# Patient Record
Sex: Female | Born: 1965 | Race: Black or African American | Hispanic: No | Marital: Single | Smoking: Never smoker
Health system: Southern US, Community
[De-identification: ages and names within clinical notes are randomized; demographics above are authoritative.]

## PROBLEM LIST (undated history)

## (undated) DIAGNOSIS — R51 Headache: Secondary | ICD-10-CM

## (undated) DIAGNOSIS — R519 Headache, unspecified: Secondary | ICD-10-CM

## (undated) DIAGNOSIS — R030 Elevated blood-pressure reading, without diagnosis of hypertension: Secondary | ICD-10-CM

## (undated) DIAGNOSIS — R87629 Unspecified abnormal cytological findings in specimens from vagina: Secondary | ICD-10-CM

## (undated) DIAGNOSIS — A549 Gonococcal infection, unspecified: Secondary | ICD-10-CM

## (undated) DIAGNOSIS — D573 Sickle-cell trait: Secondary | ICD-10-CM

## (undated) DIAGNOSIS — F419 Anxiety disorder, unspecified: Secondary | ICD-10-CM

## (undated) HISTORY — DX: Gonococcal infection, unspecified: A54.9

## (undated) HISTORY — DX: Headache: R51

## (undated) HISTORY — DX: Sickle-cell trait: D57.3

## (undated) HISTORY — DX: Elevated blood-pressure reading, without diagnosis of hypertension: R03.0

## (undated) HISTORY — PX: ABLATION SAPHENOUS VEIN W/ RFA: SUR11

## (undated) HISTORY — DX: Unspecified abnormal cytological findings in specimens from vagina: R87.629

## (undated) HISTORY — DX: Headache, unspecified: R51.9

## (undated) HISTORY — PX: WISDOM TOOTH EXTRACTION: SHX21

---

## 1986-07-07 DIAGNOSIS — A549 Gonococcal infection, unspecified: Secondary | ICD-10-CM

## 1986-07-07 HISTORY — DX: Gonococcal infection, unspecified: A54.9

## 1998-07-06 ENCOUNTER — Emergency Department (HOSPITAL_COMMUNITY): Admission: EM | Admit: 1998-07-06 | Discharge: 1998-07-06 | Payer: Self-pay | Admitting: Emergency Medicine

## 2003-06-02 ENCOUNTER — Encounter: Admission: RE | Admit: 2003-06-02 | Discharge: 2003-06-02 | Payer: Self-pay | Admitting: Unknown Physician Specialty

## 2007-04-22 ENCOUNTER — Encounter: Admission: RE | Admit: 2007-04-22 | Discharge: 2007-04-22 | Payer: Self-pay | Admitting: Family Medicine

## 2007-04-29 ENCOUNTER — Encounter: Admission: RE | Admit: 2007-04-29 | Discharge: 2007-04-29 | Payer: Self-pay | Admitting: Family Medicine

## 2008-01-03 ENCOUNTER — Encounter: Admission: RE | Admit: 2008-01-03 | Discharge: 2008-01-03 | Payer: Self-pay | Admitting: Family Medicine

## 2008-01-05 HISTORY — PX: TOE SURGERY: SHX1073

## 2008-01-13 ENCOUNTER — Ambulatory Visit (HOSPITAL_BASED_OUTPATIENT_CLINIC_OR_DEPARTMENT_OTHER): Admission: RE | Admit: 2008-01-13 | Discharge: 2008-01-13 | Payer: Self-pay | Admitting: Orthopedic Surgery

## 2008-04-24 ENCOUNTER — Encounter: Admission: RE | Admit: 2008-04-24 | Discharge: 2008-04-24 | Payer: Self-pay | Admitting: Family Medicine

## 2008-12-08 ENCOUNTER — Ambulatory Visit: Payer: Self-pay | Admitting: Family Medicine

## 2008-12-08 DIAGNOSIS — D179 Benign lipomatous neoplasm, unspecified: Secondary | ICD-10-CM | POA: Insufficient documentation

## 2008-12-08 DIAGNOSIS — R030 Elevated blood-pressure reading, without diagnosis of hypertension: Secondary | ICD-10-CM

## 2009-04-16 ENCOUNTER — Ambulatory Visit: Payer: Self-pay | Admitting: Family Medicine

## 2009-04-16 LAB — CONVERTED CEMR LAB
ALT: 16 units/L (ref 0–35)
AST: 21 units/L (ref 0–37)
Albumin: 3.9 g/dL (ref 3.5–5.2)
Alkaline Phosphatase: 45 units/L (ref 39–117)
BUN: 9 mg/dL (ref 6–23)
Basophils Absolute: 0 10*3/uL (ref 0.0–0.1)
Basophils Relative: 0.4 % (ref 0.0–3.0)
Bilirubin Urine: NEGATIVE
Bilirubin, Direct: 0.1 mg/dL (ref 0.0–0.3)
CO2: 27 meq/L (ref 19–32)
Calcium: 8.7 mg/dL (ref 8.4–10.5)
Chloride: 105 meq/L (ref 96–112)
Cholesterol: 147 mg/dL (ref 0–200)
Creatinine, Ser: 0.8 mg/dL (ref 0.4–1.2)
Eosinophils Absolute: 0 10*3/uL (ref 0.0–0.7)
Eosinophils Relative: 0.2 % (ref 0.0–5.0)
GFR calc non Af Amer: 101.09 mL/min (ref >60)
Glucose, Bld: 76 mg/dL (ref 70–99)
HCT: 43.2 % (ref 36.0–46.0)
HDL: 53.8 mg/dL (ref >39.00)
Hemoglobin, Urine: NEGATIVE
Hemoglobin: 15 g/dL (ref 12.0–15.0)
Ketones, ur: NEGATIVE mg/dL
LDL Cholesterol: 85 mg/dL (ref 0–99)
Leukocytes, UA: NEGATIVE
Lymphocytes Relative: 34.3 % (ref 12.0–46.0)
Lymphs Abs: 3.6 10*3/uL (ref 0.7–4.0)
MCHC: 34.8 g/dL (ref 30.0–36.0)
MCV: 90.5 fL (ref 78.0–100.0)
Monocytes Absolute: 0.4 10*3/uL (ref 0.1–1.0)
Monocytes Relative: 4.1 % (ref 3.0–12.0)
Neutro Abs: 6.5 10*3/uL (ref 1.4–7.7)
Neutrophils Relative %: 61 % (ref 43.0–77.0)
Nitrite: NEGATIVE
Platelets: 267 10*3/uL (ref 150.0–400.0)
Potassium: 4 meq/L (ref 3.5–5.1)
RBC: 4.77 M/uL (ref 3.87–5.11)
RDW: 11.9 % (ref 11.5–14.6)
Sodium: 138 meq/L (ref 135–145)
Specific Gravity, Urine: 1.02 (ref 1.000–1.030)
TSH: 1.91 microintl units/mL (ref 0.35–5.50)
Total Bilirubin: 0.6 mg/dL (ref 0.3–1.2)
Total CHOL/HDL Ratio: 3
Total Protein, Urine: NEGATIVE mg/dL
Total Protein: 7.3 g/dL (ref 6.0–8.3)
Triglycerides: 41 mg/dL (ref 0.0–149.0)
Urine Glucose: NEGATIVE mg/dL
Urobilinogen, UA: 0.2 (ref 0.0–1.0)
VLDL: 8.2 mg/dL (ref 0.0–40.0)
WBC: 10.5 10*3/uL (ref 4.5–10.5)
pH: 5.5 (ref 5.0–8.0)

## 2009-04-24 ENCOUNTER — Encounter: Payer: Self-pay | Admitting: Family Medicine

## 2009-04-24 ENCOUNTER — Ambulatory Visit: Payer: Self-pay | Admitting: Family Medicine

## 2009-04-24 ENCOUNTER — Other Ambulatory Visit: Admission: RE | Admit: 2009-04-24 | Discharge: 2009-04-24 | Payer: Self-pay | Admitting: Family Medicine

## 2009-04-24 DIAGNOSIS — F4323 Adjustment disorder with mixed anxiety and depressed mood: Secondary | ICD-10-CM

## 2009-04-24 DIAGNOSIS — N6019 Diffuse cystic mastopathy of unspecified breast: Secondary | ICD-10-CM

## 2009-04-25 ENCOUNTER — Encounter: Admission: RE | Admit: 2009-04-25 | Discharge: 2009-04-25 | Payer: Self-pay | Admitting: Family Medicine

## 2009-05-16 ENCOUNTER — Ambulatory Visit: Payer: Self-pay | Admitting: Family Medicine

## 2009-06-22 ENCOUNTER — Telehealth: Payer: Self-pay | Admitting: Family Medicine

## 2009-08-23 ENCOUNTER — Ambulatory Visit: Payer: Self-pay | Admitting: Family Medicine

## 2009-09-07 ENCOUNTER — Telehealth: Payer: Self-pay | Admitting: Family Medicine

## 2010-01-08 ENCOUNTER — Ambulatory Visit: Payer: Self-pay | Admitting: Family Medicine

## 2010-01-08 DIAGNOSIS — N3000 Acute cystitis without hematuria: Secondary | ICD-10-CM | POA: Insufficient documentation

## 2010-01-08 DIAGNOSIS — N390 Urinary tract infection, site not specified: Secondary | ICD-10-CM

## 2010-01-08 LAB — CONVERTED CEMR LAB
Bilirubin Urine: NEGATIVE
Glucose, Urine, Semiquant: NEGATIVE
Ketones, urine, test strip: NEGATIVE
Nitrite: NEGATIVE
Protein, U semiquant: NEGATIVE
Specific Gravity, Urine: <1.005
Urobilinogen, UA: 0.2
pH: 7.5

## 2010-01-23 ENCOUNTER — Telehealth: Payer: Self-pay | Admitting: Family Medicine

## 2010-02-04 ENCOUNTER — Telehealth: Payer: Self-pay | Admitting: Family Medicine

## 2010-04-18 ENCOUNTER — Ambulatory Visit: Payer: Self-pay | Admitting: Family Medicine

## 2010-04-18 LAB — CONVERTED CEMR LAB
ALT: 14 units/L (ref 0–35)
AST: 18 units/L (ref 0–37)
Albumin: 3.7 g/dL (ref 3.5–5.2)
Alkaline Phosphatase: 51 units/L (ref 39–117)
BUN: 11 mg/dL (ref 6–23)
Basophils Absolute: 0 10*3/uL (ref 0.0–0.1)
Basophils Relative: 0.5 % (ref 0.0–3.0)
Bilirubin Urine: NEGATIVE
Bilirubin, Direct: 0.1 mg/dL (ref 0.0–0.3)
CO2: 28 meq/L (ref 19–32)
Calcium: 9 mg/dL (ref 8.4–10.5)
Chloride: 103 meq/L (ref 96–112)
Cholesterol: 153 mg/dL (ref 0–200)
Creatinine, Ser: 0.8 mg/dL (ref 0.4–1.2)
Eosinophils Absolute: 0 10*3/uL (ref 0.0–0.7)
Eosinophils Relative: 0.5 % (ref 0.0–5.0)
GFR calc non Af Amer: 103.59 mL/min (ref >60)
Glucose, Bld: 82 mg/dL (ref 70–99)
HCT: 41.7 % (ref 36.0–46.0)
HDL: 55.1 mg/dL (ref >39.00)
Hemoglobin, Urine: NEGATIVE
Hemoglobin: 14.7 g/dL (ref 12.0–15.0)
Ketones, ur: NEGATIVE mg/dL
LDL Cholesterol: 87 mg/dL (ref 0–99)
Leukocytes, UA: NEGATIVE
Lymphocytes Relative: 37.9 % (ref 12.0–46.0)
Lymphs Abs: 3.2 10*3/uL (ref 0.7–4.0)
MCHC: 35.3 g/dL (ref 30.0–36.0)
MCV: 92.6 fL (ref 78.0–100.0)
Monocytes Absolute: 0.4 10*3/uL (ref 0.1–1.0)
Monocytes Relative: 5 % (ref 3.0–12.0)
Neutro Abs: 4.7 10*3/uL (ref 1.4–7.7)
Neutrophils Relative %: 56.1 % (ref 43.0–77.0)
Nitrite: NEGATIVE
Platelets: 283 10*3/uL (ref 150.0–400.0)
Potassium: 4.5 meq/L (ref 3.5–5.1)
RBC: 4.5 M/uL (ref 3.87–5.11)
RDW: 12.9 % (ref 11.5–14.6)
Sodium: 137 meq/L (ref 135–145)
Specific Gravity, Urine: 1.015 (ref 1.000–1.030)
TSH: 2.51 microintl units/mL (ref 0.35–5.50)
Total Bilirubin: 0.4 mg/dL (ref 0.3–1.2)
Total CHOL/HDL Ratio: 3
Total Protein, Urine: NEGATIVE mg/dL
Total Protein: 7.2 g/dL (ref 6.0–8.3)
Triglycerides: 57 mg/dL (ref 0.0–149.0)
Urine Glucose: NEGATIVE mg/dL
Urobilinogen, UA: 0.2 (ref 0.0–1.0)
VLDL: 11.4 mg/dL (ref 0.0–40.0)
WBC: 8.4 10*3/uL (ref 4.5–10.5)
pH: 7.5 (ref 5.0–8.0)

## 2010-04-25 ENCOUNTER — Telehealth: Payer: Self-pay | Admitting: Family Medicine

## 2010-04-25 ENCOUNTER — Ambulatory Visit: Payer: Self-pay | Admitting: Family Medicine

## 2010-04-25 ENCOUNTER — Other Ambulatory Visit: Admission: RE | Admit: 2010-04-25 | Discharge: 2010-04-25 | Payer: Self-pay | Admitting: Family Medicine

## 2010-04-25 LAB — CONVERTED CEMR LAB: Pap Smear: NEGATIVE

## 2010-04-26 ENCOUNTER — Encounter: Admission: RE | Admit: 2010-04-26 | Discharge: 2010-04-26 | Payer: Self-pay | Admitting: Family Medicine

## 2010-08-08 NOTE — Assessment & Plan Note (Signed)
Summary: CPX/PAP/CJR   Vital Signs:  Patient profile:   45 year old female Menstrual status:  regular LMP:     04/13/2010 Height:      64.25 inches Weight:      157 pounds Temp:     98.2 degrees F oral BP sitting:   118 / 80  (left arm)  Vitals Entered By: Kern Reap CMA Duncan Dull) (April 25, 2010 9:44 AM) CC: cpx LMP (date): 04/13/2010     Enter LMP: 04/13/2010 Last PAP Result NEGATIVE FOR INTRAEPITHELIAL LESIONS OR MALIGNANCY.   CC:  cpx.  History of Present Illness: Erin [name] is a 45 year old single female, nonsmoker, who comes in today for general physical examination,  She's always been in excellent health.  She had no chronic health problems.  She does take her BCPs.  LMP 10 8, normal.  She does BSE monthly and gets annual mammography.  Negative family history of breast cancer.  However, she does have fibrocystic changes throughout both breasts.  She takes Celexa 40 mg nightly for mild depression and would like to try to taper off her Celexa.  She gets routine eye care, dental care, BSE monthly, annual mammography, tetanus, 2010, seasonal flu 2011  Allergies: 1)  ! Sulfamethoxazole (Sulfamethoxazole)  Past History:  Past medical, surgical, family and social histories (including risk factors) reviewed, and no changes noted (except as noted below).  Past Medical History: Reviewed history from 12/08/2008 and no changes required. broken toe vein ablation sickle cell trait high blood pressure - hx  Past Surgical History: Reviewed history from 12/08/2008 and no changes required. broken toe vein ablation  Family History: Reviewed history from 12/08/2008 and no changes required. Father: deceased - TB? Mother: HTN Siblings: 3 sisters               1- HTN               1- Gall bladder removed               1 - joint issues  Social History: Reviewed history from 12/08/2008 and no changes required. 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 No apparent alopecia or balding. Eyes:  No corneal or conjunctival inflammation noted. EOMI. Perrla. Funduscopic exam benign, without hemorrhages, exudates or papilledema. Vision grossly normal. Ears:  External ear exam shows no significant lesions or deformities.  Otoscopic examination reveals clear canals, tympanic membranes are intact bilaterally without bulging, retraction, inflammation or discharge. Hearing is grossly normal bilaterally. Nose:  External nasal examination shows no deformity or inflammation. Nasal mucosa are pink and moist without lesions or exudates. Mouth:  Oral mucosa and oropharynx without lesions or exudates.  Teeth in good repair. Neck:  No deformities, masses, or tenderness noted. Chest Wall:  No deformities, masses, or tenderness noted. Breasts:  she has fibrocystic changes throughout both breasts at the 12 o'clock position. Lungs:  Normal respiratory effort, chest expands symmetrically. Lungs are clear to auscultation, no crackles or wheezes. Heart:  Normal rate and regular rhythm. S1 and S2 normal without gallop, murmur, click, rub or other extra sounds. Abdomen:  Bowel sounds positive,abdomen soft and non-tender without masses, organomegaly or hernias noted. Rectal:  No external abnormalities noted. Normal sphincter tone. No rectal masses or tenderness. Genitalia:  Pelvic  Exam:        External: normal female genitalia without lesions or masses        Vagina: normal without lesions or masses        Cervix: normal without lesions or masses        Adnexa: normal bimanual exam without masses or fullness        Uterus: normal by palpation        Pap smear: performed Msk:  No  deformity or scoliosis noted of thoracic or lumbar spine.   Pulses:  R and L carotid,radial,femoral,dorsalis pedis and posterior tibial pulses are full and equal bilaterally Extremities:  No clubbing, cyanosis, edema, or deformity noted with normal full range of motion of all joints.   Neurologic:  No cranial nerve deficits noted. Station and gait are normal. Plantar reflexes are down-going bilaterally. DTRs are symmetrical throughout. Sensory, motor and coordinative functions appear intact.   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 Take one tab once daily 2)  Glucosamine Complex Tabs (Nutritional supplements) .... Once daily 3)  Daily Multi Tabs (Multiple vitamins-minerals) .... Once daily 4)  Aspir-low 81 Mg Tbec (Aspirin) .... Once daily 5)  Celexa 40 Mg Tabs (Citalopram hydrobromide) .... One nightly  Patient Instructions: 1)  begin to taper the Celexa by taking 20 mg nightly at bedtime for one month then 20 mg Monday, Wednesday, Friday, for another month, then stop if you feel okay.  If not, we start 40 mg in stay on it and we will discuss it next year 2)  Please schedule a follow-up appointment in 1 year. 3)  It is important that you exercise regularly at least 20 minutes 5 times a week. If you develop chest pain, have severe difficulty breathing, or feel very tired , stop exercising immediately and seek medical attention. 4)  Schedule your mammogram. 5)  Take an Aspirin every day. Prescriptions: CELEXA 40 MG TABS (CITALOPRAM HYDROBROMIDE) One nightly   #100 x 3   Entered and Authorized by:   Roderick Pee MD   Signed by:   Roderick Pee MD on 04/25/2010   Method used:   Electronically to        [facility] [phone]* (retail)       [address] [place], [place]  [address]       Ph: [phone]       Fax: [phone]   RxID:   [id number] YAZ 3-0.02 MG TABS (DROSPIRENONE-ETHINYL ESTRADIOL) take one tab once daily  #3 x 3   Entered and Authorized by:   Roderick Pee MD   Signed by:   Roderick Pee MD on 04/25/2010   Method used:   Electronically to        [facility] # [phone]* (retail)       [address] [place], [place]  [address]       Ph: [phone]       Fax: [phone]   RxID:   [id number]    Orders Added: 1)  Prescription Created Electronically [phone]  2)  Est. Patient 40-64 years [[id number]]   Immunization History:  Influenza Immunization History:    Influenza:  historical (04/06/2010)   Immunization History:  Influenza Immunization History:    Influenza:  Historical (04/06/2010)

## 2010-08-08 NOTE — Progress Notes (Signed)
Summary: Pt changing pharmacy to [facility]-Req Dianah Field and Celexa  Phone Note Refill Request Message from:  Patient on January 23, 2010 10:34 AM  Refills Requested: Medication #1:  YAZ 3-0.02 MG TABS take one tab once daily   Dosage confirmed as above?Dosage Confirmed   Brand Name Necessary? Yes   Supply Requested: 3 months  Medication #2:  CELEXA 40 MG TABS One nightly   Dosage confirmed as above?Dosage Confirmed   Brand Name Necessary? No   Supply Requested: 3 months Pt called and said that [facility]. Pls call in to [facility] [phone] or [phone]        Method Requested: Telephone to [facility]  Next Appointment Scheduled: cpx on 04/25/10  Initial call taken by: Lucy Antigua,  January 23, 2010 10:40 AM Caller: Patient    Prescriptions: CELEXA 40 MG TABS (CITALOPRAM HYDROBROMIDE) One nightly  #100 x 3   Entered by:   Kathrynn Speed CMA   Authorized by:   Roderick Pee MD   Signed by:   Kathrynn Speed CMA on 01/23/2010   Method used:   Faxed to ...       [facility] (mail-order)             , [place]         Ph: [phone]       Fax: [phone]   RxID:   [id number] YAZ 3-0.02 MG TABS (DROSPIRENONE-ETHINYL ESTRADIOL) take one tab once daily  #3 x 3   Entered by:   Kathrynn Speed CMA   Authorized by:   Roderick Pee MD   Signed by:   Kathrynn Speed CMA on 01/23/2010   Method used:   Faxed to ...       [facility] (mail-order)             , [place]         Ph: [phone]       Fax: [phone]   RxID:   [id number]

## 2010-08-08 NOTE — Assessment & Plan Note (Signed)
Summary: ? cystitis//ccm   Vital Signs:  Patient profile:   45 year old female Menstrual status:  regular Height:      64.25 inches Weight:      157 pounds BMI:     26.84 Temp:     98.0 degrees F oral BP sitting:   120 / 80  (left arm) Cuff size:   regular  Vitals Entered By: Kern Reap CMA Duncan Dull) (January 08, 2010 12:22 PM) CC: possible uti   CC:  possible uti.  History of Present Illness: Erin [name] is a 45 year old female, who comes in with a 3-day history of frequency and dysuria.  No fever, chills, or back pain.  LMP 3 weeks ago, normal.  On BCPs  Allergies: 1)  ! Sulfamethoxazole (Sulfamethoxazole)  Past History:  Past medical, surgical, family and social histories (including risk factors) reviewed for relevance to current acute and chronic problems.  Past Medical History: Reviewed history from 12/08/2008 and no changes required. broken toe vein ablation sickle cell trait high blood pressure - hx  Past Surgical History: Reviewed history from 12/08/2008 and no changes required. broken toe vein ablation  Family History: Reviewed history from 12/08/2008 and no changes required. Father: deceased - TB? Mother: HTN Siblings: 3 sisters               1- HTN               1- Gall bladder removed               1 - joint issues  Social History: Reviewed history from 12/08/2008 and no changes required. 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   Problems:  Medical Problems Added: 1)  Dx of Uti  (ICD-599.0) 2)  Dx of Acute Cystitis  (ICD-595.0)  Impression & Recommendations:  Problem # 1:  UTI (ICD-599.0) Assessment  New  Her updated medication list for this problem includes:    Ciprofloxacin Hcl 500 Mg Tabs (Ciprofloxacin hcl) .Marland Kitchen... Take 1 tablet by mouth two times a day  Orders: Prescription Created Electronically [phone])  Complete Medication List: 1)  Yaz 3-0.02 Mg Tabs (Drospirenone-ethinyl estradiol) .... Take one tab once daily 2)  Glucosamine Complex Tabs (Nutritional supplements) .... Once daily 3)  Daily Multi Tabs (Multiple vitamins-minerals) .... Once daily 4)  Aspir-low 81 Mg Tbec (Aspirin) .... Once daily 5)  Celexa 40 Mg Tabs (Citalopram hydrobromide) .... One nightly 6)  Ciprofloxacin Hcl 500 Mg Tabs (Ciprofloxacin hcl) .... Take 1 tablet by mouth two times a day  Other Orders: UA Dipstick w/o Micro (manual) ([id number])  Patient Instructions: 1)  30 ounces of water daily, otc Pyridium 3 times a day as needed.  Also, Cipro, one twice daily for one week.  Return p.r.n. Prescriptions: CIPROFLOXACIN HCL 500 MG TABS (CIPROFLOXACIN HCL) Take 1 tablet by mouth two times a day  #14 x 1   Entered and Authorized by:   Roderick Pee MD   Signed by:   Roderick Pee MD on 01/08/2010   Method used:   Electronically to        [facility] # [phone]* (retail)       [address] [place]  [place], [place]  [address]       Ph: [phone]       Fax: [phone]   RxID:   [id number]   Laboratory Results   Urine Tests  Date/Time Received: January 08, 2010   Routine Urinalysis   Color: yellow Appearance: Clear Glucose: negative   (Normal Range: Negative) Bilirubin: negative   (Normal Range: Negative) Ketone: negative   (Normal Range: Negative) Spec. Gravity: <1.005   (Normal Range: 1.003-1.035) Blood: trace-lysed   (Normal Range: Negative) pH: 7.5   (Normal Range: 5.0-8.0) Protein: negative   (Normal Range: Negative) Urobilinogen: 0.2   (Normal Range: 0-1) Nitrite: negative   (Normal Range: Negative) Leukocyte Esterace: moderate   (Normal Range: Negative)    Comments: Kern Reap CMA (AAMA)  January 08, 2010 12:26 PM

## 2010-08-08 NOTE — Progress Notes (Signed)
Summary: refill  Phone Note Refill Request Call back at Work Phone [phone] Message from:  Patient---live call on Brand name only  Refills Requested: Medication #1:  YAZ 3-0.02 MG TABS take one tab once daily send to [facility] name only  Initial call taken by: Warnell Forester,  February 04, 2010 9:53 AM    Prescriptions: YAZ 3-0.02 MG TABS (DROSPIRENONE-ETHINYL ESTRADIOL) take one tab once daily  #90 days x 3   Entered by:   Kern Reap CMA (AAMA)   Authorized by:   Roderick Pee MD   Signed by:   Kern Reap CMA (AAMA) on 02/04/2010   Method used:   Faxed to ...       [facility] (mail-order)             , [place]         Ph: [phone]       Fax: [phone]   RxID:   [id number]

## 2010-08-08 NOTE — Assessment & Plan Note (Signed)
Summary: chest tightness/njr   Vital Signs:  Patient profile:   45 year old female Menstrual status:  regular Weight:      161 pounds Temp:     98.6 degrees F oral BP sitting:   122 / 88  (left arm) Cuff size:   regular  Vitals Entered By: Kern Reap CMA Duncan Dull) (August 23, 2009 10:11 AM)  Reason for Visit cough and chest congestion  History of Present Illness: Erin [name] is a 45 year old single female, nonsmoker, who comes in today with a 4-day history of head congestion, sore throat, nonproductive cough.  She's had no fever, chills, earache, etc.  Review of systems negative  Allergies: 1)  ! Sulfamethoxazole (Sulfamethoxazole)  Past History:  Past medical, surgical, family and social histories (including risk factors) reviewed for relevance to current acute and chronic problems.  Past Medical History: Reviewed history from 12/08/2008 and no changes required. broken toe vein ablation sickle cell trait high blood pressure - hx  Past Surgical History: Reviewed history from 12/08/2008 and no changes required. broken toe vein ablation  Family History: Reviewed history from 12/08/2008 and no changes required. Father: deceased - TB? Mother: HTN Siblings: 3 sisters               1- HTN               1- Gall bladder removed               1 - joint issues  Social History: Reviewed history from 12/08/2008 and no changes required. 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 No apparent alopecia or balding. Eyes:  No corneal or conjunctival inflammation noted. EOMI. Perrla. Funduscopic exam benign, without hemorrhages, exudates or papilledema. Vision grossly normal. Ears:   External ear exam shows no significant lesions or deformities.  Otoscopic examination reveals clear canals, tympanic membranes are intact bilaterally without bulging, retraction, inflammation or discharge. Hearing is grossly normal bilaterally. Nose:  External nasal examination shows no deformity or inflammation. Nasal mucosa are pink and moist without lesions or exudates. Mouth:  Oral mucosa and oropharynx without lesions or exudates.  Teeth in good repair. Neck:  No deformities, masses, or tenderness noted. Chest Wall:  No deformities, masses, or tenderness noted. Lungs:  Normal respiratory effort, chest expands symmetrically. Lungs are clear to auscultation, no crackles or wheezes.   Problems:  Medical Problems Added: 1)  Dx of Viral Infection-unspec  (ICD-079.99)  Impression & Recommendations:  Problem # 1:  VIRAL INFECTION-UNSPEC (ICD-079.99) Assessment New  Her updated medication list for this problem includes:    Aspir-low 81 Mg Tbec (Aspirin) ..... Once daily    Hydromet 5-1.5 Mg/66ml Syrp (Hydrocodone-homatropine) .Marland Kitchen... 1 or 2 tsps at bedtime as needed  Complete Medication List: 1)  Yaz 3-0.02 Mg Tabs (Drospirenone-ethinyl estradiol) .... Take one tab once daily 2)  Glucosamine Complex Tabs (Nutritional supplements) .... Once daily 3)  Daily Multi Tabs (Multiple vitamins-minerals) .... Once daily 4)  Aspir-low 81 Mg Tbec (Aspirin) .... Once daily 5)  Celexa 40 Mg Tabs (Citalopram hydrobromide) .... One nightly 6)  Hydromet 5-1.5 Mg/56ml Syrp (Hydrocodone-homatropine) .Marland Kitchen.. 1 or 2 tsps at bedtime as needed  Patient Instructions: 1)  drink 30 ounces of water daily, vaporizer in your bedroom at night,......... one  or 2 teaspoons of Hydromet at bedtime as needed for cough and cold.  Return p.r.n. Prescriptions: HYDROMET 5-1.5 MG/5ML SYRP (HYDROCODONE-HOMATROPINE) 1 or 2 tsps at bedtime as needed  #8oz x 0   Entered and Authorized by:   Roderick Pee MD   Signed by:   Roderick Pee MD on 08/23/2009   Method used:   Print then Give to Patient   RxID:   [id number]

## 2010-08-08 NOTE — Progress Notes (Signed)
Summary: hacking cough  Phone Note Call from Patient Call back at [phone]   Summary of Call: Still hacking day & night.  Out of the cough syrup you gave.  Otherwise much better.  [facility]  Allergic to sulfa. Initial call taken by: Rudy Jew, RN,  September 07, 2009 12:11 PM  Follow-up for Phone Call        renew Hydromet, 4 ounces directions one to 2 teaspoons t.i.d. p.r.n. cough, 30 ounces of water daily, vaporizer in her bedroom at night, see me Monday if symptoms don't improve Follow-up by: Roderick Pee MD,  September 07, 2009 5:20 PM  Additional Follow-up for Phone Call Additional follow up Details #1::        Rx Called In. patient is aware. Additional Follow-up by: Kern Reap CMA Duncan Dull),  September 07, 2009 5:27 PM

## 2010-08-08 NOTE — Progress Notes (Signed)
Summary: PHARMACY VERIFICATION  Phone Note From Pharmacy   [facility] [phone] Summary of Call: [facility] to adv [facility] document to [facility] inquiring about possible interaction between meds:  Celexa 40mg   and  Yaz..... Can you advise okay for them to fill?  Initial call taken by: Debbra Riding,  April 25, 2010 4:38 PM  Follow-up for Phone Call        ok Follow-up by: Roderick Pee MD,  April 25, 2010 4:42 PM  Additional Follow-up for Phone Call Additional follow up Details #1::        I called and spoke with Pharmacist (Gus) and advised him of Dr Barbette Or notation..... He acknowledged same - ok to fill as is.  Additional Follow-up by: Debbra Riding,  April 25, 2010 4:45 PM

## 2010-08-12 ENCOUNTER — Ambulatory Visit (INDEPENDENT_AMBULATORY_CARE_PROVIDER_SITE_OTHER): Payer: BC Managed Care – PPO | Admitting: Family Medicine

## 2010-08-12 ENCOUNTER — Encounter: Payer: Self-pay | Admitting: Family Medicine

## 2010-08-12 VITALS — BP 130/90 | Temp 98.9°F | Ht 64.0 in | Wt 158.0 lb

## 2010-08-12 DIAGNOSIS — F329 Major depressive disorder, single episode, unspecified: Secondary | ICD-10-CM

## 2010-08-12 DIAGNOSIS — F4323 Adjustment disorder with mixed anxiety and depressed mood: Secondary | ICD-10-CM

## 2010-08-12 MED ORDER — CITALOPRAM HYDROBROMIDE 40 MG PO TABS: ORAL_TABLET | ORAL | Status: DC
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
## 2010-08-12 NOTE — Patient Instructions (Signed)
Increase the Celexa to 60 mg nightly at bedtime.  If after two months u r  still not sleeping well.  Call and we will discuss increasing the dose to 80 mg.  Call   Victorino Dike and set up an appointment to see Judithe Modest for further evaluation

## 2010-08-15 ENCOUNTER — Ambulatory Visit: Payer: BC Managed Care – PPO | Admitting: Licensed Clinical Social Worker

## 2010-08-15 DIAGNOSIS — F411 Generalized anxiety disorder: Secondary | ICD-10-CM

## 2010-08-15 DIAGNOSIS — F331 Major depressive disorder, recurrent, moderate: Secondary | ICD-10-CM

## 2010-08-21 ENCOUNTER — Ambulatory Visit (INDEPENDENT_AMBULATORY_CARE_PROVIDER_SITE_OTHER): Payer: BC Managed Care – PPO | Admitting: Licensed Clinical Social Worker

## 2010-08-21 DIAGNOSIS — F331 Major depressive disorder, recurrent, moderate: Secondary | ICD-10-CM

## 2010-08-21 DIAGNOSIS — F411 Generalized anxiety disorder: Secondary | ICD-10-CM

## 2010-08-28 ENCOUNTER — Ambulatory Visit (INDEPENDENT_AMBULATORY_CARE_PROVIDER_SITE_OTHER): Payer: BC Managed Care – PPO | Admitting: Licensed Clinical Social Worker

## 2010-08-28 DIAGNOSIS — F411 Generalized anxiety disorder: Secondary | ICD-10-CM

## 2010-08-28 DIAGNOSIS — F331 Major depressive disorder, recurrent, moderate: Secondary | ICD-10-CM

## 2010-09-11 ENCOUNTER — Ambulatory Visit (INDEPENDENT_AMBULATORY_CARE_PROVIDER_SITE_OTHER): Payer: BC Managed Care – PPO | Admitting: Licensed Clinical Social Worker

## 2010-09-11 DIAGNOSIS — F411 Generalized anxiety disorder: Secondary | ICD-10-CM

## 2010-09-11 DIAGNOSIS — F331 Major depressive disorder, recurrent, moderate: Secondary | ICD-10-CM

## 2010-09-12 ENCOUNTER — Ambulatory Visit: Payer: BC Managed Care – PPO | Admitting: Licensed Clinical Social Worker

## 2010-10-15 ENCOUNTER — Ambulatory Visit (INDEPENDENT_AMBULATORY_CARE_PROVIDER_SITE_OTHER): Payer: BC Managed Care – PPO | Admitting: Family Medicine

## 2010-10-15 ENCOUNTER — Encounter: Payer: Self-pay | Admitting: Family Medicine

## 2010-10-15 DIAGNOSIS — F411 Generalized anxiety disorder: Secondary | ICD-10-CM

## 2010-10-15 DIAGNOSIS — F4323 Adjustment disorder with mixed anxiety and depressed mood: Secondary | ICD-10-CM

## 2010-10-15 NOTE — Patient Instructions (Signed)
Decrease the Celexa to 40 mg daily, and then in one month if you continue to feel well decreased the Celexa to 20 mg daily.  Return in two months for follow-up, sooner if any problems

## 2010-10-15 NOTE — Progress Notes (Signed)
  Subjective:    Patient ID: Erin [name], female    DOB: 03-07-1966, 45 y.o.   MRN: [id number]  HPI Erin [name] is a 46 year old female, who comes in today for follow-up of anxiety.  She is currently on 60 mg of Celexa, day, and feels much better during she did some counseling with Judithe Modest.  She wonders if she can come off her medication.  Physical examination October 2012 normal   Review of Systems Psychiatric in general, review of systems negative    Objective:   Physical Exam    Well-developed well-nourished, female, in no acute distress    Assessment & Plan:  General anxiety disorder, and,,,,,,,,,,, taper Celexa, return in two months p.r.n.

## 2010-11-19 NOTE — Op Note (Signed)
[name], [name]                ACCOUNT NO.:  [id number]   MEDICAL RECORD NO.:  [id number]          PATIENT TYPE:  AMB   LOCATION:  [facility]                         FACILITY:  [facility]   PHYSICIAN:  Deidre Ala, M.D.    DATE OF BIRTH:  Nov 13, 1965   DATE OF PROCEDURE:  01/13/2008  DATE OF DISCHARGE:                               OPERATIVE REPORT   PREOPERATIVE DIAGNOSIS:  Fracture second metatarsal head, intra-  articular fragment flipped out of the joint above it dorsally.   POSTOPERATIVE DIAGNOSIS:  Fracture second metatarsal head, intra-  articular fragment flipped out of the joint above it dorsally.   PROCEDURE:  ORIF of fracture fragment, intra-articular right second  metatarsal head, right foot with longitudinal K-wire pinning 0.45 with C-  arm fluoroscopy.   SURGEON:  1. Charlesetta Shanks, M.D.   ASSISTANT:  Phineas Semen, P.A.-C.   ANESTHESIA:  General with LMA.   CULTURES:  None.   DRAINS:  None.   BLOOD LOSS:  Minimal.   TOURNIQUET TIME:  The tourniquet time is 29 minutes.   PATHOLOGIC FINDINGS AND HISTORY:  Erin [name] had a fall sustaining this  injury.  This was on January 03, 2008.  Two days prior, she had injured it.  Fracture fragment was noted to be flipped up and dorsal to the joint of  the MTP joint.  She had a CT scan which confirmed the diagnosis and so  we decided that we were going to have to remove the fragment and either  would be able to, depending on the configuration, excise it versus  reduce it.  At surgery it was flipped up 90 degrees completely up and  out of the joint.  We were able to, with blood supply still attached,  put it back down in, flipping it to a normal position opposite the base  of the proximal phalanx of the second toe and, with one pass, placed a  0.45 K-wire through all three distal burns from distal to proximal  through the fracture fragment into the second metatarsal and on all  views of the C-arm with obliques laterally, were able to  show that the  pin was in place and the fracture fragment in good position.  We felt  the fragment was viable and felt at this point it made sense to try to  do fixation as opposed to a hemiresection of the joint.   PROCEDURE:  With adequate anesthesia obtained using LMA technique, 1  gram Ancef given IV prophylaxis, the patient was placed in the supine  position.  The right foot was prepped and draped in standard fashion.  After standard prepping and draping, Esmarch exsanguination was used.  The tourniquet let up to 350 mmHg.  Incision was then made in the dorsal  web between 2 and 3.  Dissection was carried down to the second  metatarsal head.  The overlying extensor tendon was incised  longitudinally and retracted.  This exposed the fracture fragment which  was manipulated carefully after capsular incision was made to expose it  and reduced into the joint.  We then  pinned it from distal to proximal  as above with good position and secure fixation obtained.  C-arm  fluoroscopy confirmed position on AP and several lateral and oblique  views.  We then bent the pin so as to prevent migration and capped it  with adhesive of collodion with a pin cap.  The longitudinal incision in  the extensor tendon was then closed with a running 3-0 Vicryl.  The skin  was closed with 4-0 nylon after the tourniquet let down.  Bleeding  points were cauterized prior to closure.  Marcaine was placed in and  about the wound.  A bulky sterile compressive forefoot dressing was  applied.  The patient then, having tolerated the procedure well, was  awakened, taken to recovery room in satisfactory condition.  He has a  wooden sole fracture shoe, to be crutches, weightbearing as tolerated on  her heel, elevation, told call the office for appointment for recheck on  Monday.           ______________________________  V. Charlesetta Shanks, M.D.     VEP/MEDQ  D:  01/13/2008  T:  01/13/2008  Job:  [id number]   cc:   Nichola Sizer, M.D.  [facility]   Otho Darner, M.D.  Fax: [phone]

## 2010-12-17 ENCOUNTER — Ambulatory Visit: Payer: BC Managed Care – PPO | Admitting: Family Medicine

## 2011-03-21 ENCOUNTER — Telehealth: Payer: Self-pay | Admitting: *Deleted

## 2011-03-21 NOTE — Telephone Encounter (Signed)
Pt would like to ask Dr. Tawanna Cooler if Omega or [facility] is better??

## 2011-03-24 NOTE — Telephone Encounter (Signed)
neither

## 2011-03-24 NOTE — Telephone Encounter (Signed)
Spoke with patient.

## 2011-03-25 ENCOUNTER — Encounter: Payer: Self-pay | Admitting: Family Medicine

## 2011-03-25 ENCOUNTER — Other Ambulatory Visit: Payer: Self-pay | Admitting: Family Medicine

## 2011-03-25 ENCOUNTER — Ambulatory Visit (INDEPENDENT_AMBULATORY_CARE_PROVIDER_SITE_OTHER): Payer: BC Managed Care – PPO | Admitting: Family Medicine

## 2011-03-25 ENCOUNTER — Ambulatory Visit: Payer: BC Managed Care – PPO | Admitting: Family Medicine

## 2011-03-25 VITALS — BP 140/98 | Temp 99.2°F | Wt 158.0 lb

## 2011-03-25 DIAGNOSIS — F4323 Adjustment disorder with mixed anxiety and depressed mood: Secondary | ICD-10-CM

## 2011-03-25 DIAGNOSIS — Z1231 Encounter for screening mammogram for malignant neoplasm of breast: Secondary | ICD-10-CM

## 2011-03-25 NOTE — Progress Notes (Signed)
  Subjective:    Patient ID: Leo Rod, female    DOB: 1965-10-27, 45 y.o.   MRN: [id number]  HPI .  Stori is a 45 year old female, nonsmoker, who comes in today accompanied by a friend, who brought her to the office for evaluation of depression.  We saw her in the spring of 2012 at that time.  We felt she had symptoms of depression and start her on Celexa and over time.  Increase the dose to 60 mg a day.  She also saw Judithe Modest for counseling every week for about 8 weeks.  She did not feel like the counseling sessions were of any value.  Her friend brought her in today because she was expressing feelings of suicide.   Review of Systems    General and psychiatric review of systems otherwise negative Objective:   Physical Exam Well-developed thin, female, in no acute distress.  His oriented x 3 appropriate tearful      Assessment & Plan:  Depression unresolved with Celexa, and now with suicidal ideation.  Recommend her friend take her now to behavior health

## 2011-03-25 NOTE — Patient Instructions (Signed)
Go directly to behavior health now for evaluation

## 2011-04-03 ENCOUNTER — Other Ambulatory Visit: Payer: Self-pay | Admitting: Family Medicine

## 2011-04-03 LAB — POCT HEMOGLOBIN-HEMACUE
Hemoglobin: 15.4 — ABNORMAL HIGH
Operator id: 268271

## 2011-04-22 ENCOUNTER — Other Ambulatory Visit (INDEPENDENT_AMBULATORY_CARE_PROVIDER_SITE_OTHER): Payer: BC Managed Care – PPO

## 2011-04-22 ENCOUNTER — Other Ambulatory Visit: Payer: Self-pay | Admitting: Family Medicine

## 2011-04-22 ENCOUNTER — Other Ambulatory Visit: Payer: BC Managed Care – PPO

## 2011-04-22 DIAGNOSIS — Z0000 Encounter for general adult medical examination without abnormal findings: Secondary | ICD-10-CM

## 2011-04-22 LAB — URINALYSIS
Bilirubin Urine: NEGATIVE
Hgb urine dipstick: NEGATIVE
Leukocytes, UA: NEGATIVE
Nitrite: NEGATIVE
Specific Gravity, Urine: 1.02 (ref 1.000–1.030)
Total Protein, Urine: NEGATIVE
Urine Glucose: NEGATIVE
Urobilinogen, UA: 0.2 (ref 0.0–1.0)
pH: 6 (ref 5.0–8.0)

## 2011-04-22 LAB — BASIC METABOLIC PANEL
BUN: 8 mg/dL (ref 6–23)
CO2: 24 mEq/L (ref 19–32)
Calcium: 8.5 mg/dL (ref 8.4–10.5)
Chloride: 105 mEq/L (ref 96–112)
Creatinine, Ser: 0.8 mg/dL (ref 0.4–1.2)
GFR: 107.88 mL/min (ref >60.00)
Glucose, Bld: 67 mg/dL — ABNORMAL LOW (ref 70–99)
Potassium: 3.6 mEq/L (ref 3.5–5.1)
Sodium: 138 mEq/L (ref 135–145)

## 2011-04-22 LAB — LIPID PANEL
Cholesterol: 137 mg/dL (ref 0–200)
HDL: 56.2 mg/dL (ref >39.00)
LDL Cholesterol: 71 mg/dL (ref 0–99)
Total CHOL/HDL Ratio: 2
Triglycerides: 47 mg/dL (ref 0.0–149.0)
VLDL: 9.4 mg/dL (ref 0.0–40.0)

## 2011-04-22 LAB — TSH: TSH: 1.72 u[IU]/mL (ref 0.35–5.50)

## 2011-04-23 LAB — HEPATIC FUNCTION PANEL
ALT: 16 U/L (ref 0–35)
AST: 21 U/L (ref 0–37)
Albumin: 3.9 g/dL (ref 3.5–5.2)
Alkaline Phosphatase: 40 U/L (ref 39–117)
Bilirubin, Direct: 0.1 mg/dL (ref 0.0–0.3)
Total Bilirubin: 0.6 mg/dL (ref 0.3–1.2)
Total Protein: 7.6 g/dL (ref 6.0–8.3)

## 2011-04-23 LAB — CBC
HCT: 37.8 % (ref 36.0–46.0)
Hemoglobin: 13.8 g/dL (ref 12.0–15.0)
MCH: 30.3 pg (ref 26.0–34.0)
MCHC: 36.5 g/dL — ABNORMAL HIGH (ref 30.0–36.0)
MCV: 82.9 fL (ref 78.0–100.0)
Platelets: 305 10*3/uL (ref 150–400)
RBC: 4.56 MIL/uL (ref 3.87–5.11)
RDW: 13.9 % (ref 11.5–15.5)
WBC: 7.6 10*3/uL (ref 4.0–10.5)

## 2011-04-29 ENCOUNTER — Ambulatory Visit (INDEPENDENT_AMBULATORY_CARE_PROVIDER_SITE_OTHER): Payer: BC Managed Care – PPO | Admitting: Family Medicine

## 2011-04-29 ENCOUNTER — Encounter: Payer: Self-pay | Admitting: Family Medicine

## 2011-04-29 ENCOUNTER — Other Ambulatory Visit (HOSPITAL_COMMUNITY)
Admission: RE | Admit: 2011-04-29 | Discharge: 2011-04-29 | Disposition: A | Discharge status: Home / Self Care | Payer: BC Managed Care – PPO | Source: Ambulatory Visit | Attending: Family Medicine | Admitting: Family Medicine

## 2011-04-29 ENCOUNTER — Ambulatory Visit
Admission: RE | Admit: 2011-04-29 | Discharge: 2011-04-29 | Disposition: A | Discharge status: Home / Self Care | Payer: BC Managed Care – PPO | Source: Ambulatory Visit | Attending: Family Medicine | Admitting: Family Medicine

## 2011-04-29 DIAGNOSIS — Z0000 Encounter for general adult medical examination without abnormal findings: Secondary | ICD-10-CM

## 2011-04-29 DIAGNOSIS — F3130 Bipolar disorder, current episode depressed, mild or moderate severity, unspecified: Secondary | ICD-10-CM

## 2011-04-29 DIAGNOSIS — Z01419 Encounter for gynecological examination (general) (routine) without abnormal findings: Secondary | ICD-10-CM | POA: Insufficient documentation

## 2011-04-29 DIAGNOSIS — Z1231 Encounter for screening mammogram for malignant neoplasm of breast: Secondary | ICD-10-CM

## 2011-04-29 DIAGNOSIS — F319 Bipolar disorder, unspecified: Secondary | ICD-10-CM

## 2011-04-29 MED ORDER — DROSPIRENONE-ETHINYL ESTRADIOL 3-0.02 MG PO TABS: 1.0000 | ORAL_TABLET | Freq: Every day | ORAL | Status: DC

## 2011-04-29 NOTE — Patient Instructions (Signed)
Continue your current medications.  Follow-up in one year, sooner if any problems.  I will call you about your Pap smear

## 2011-04-29 NOTE — Progress Notes (Signed)
  Subjective:    Patient ID: Erin [name], female    DOB: 1965/08/31, 45 y.o.   MRN: [id number]  HPI Erin [name] is a delightful, 45 year old single female, nonsmoker, who comes in today for general physical examination  We had started her last February on Celexa because of symptoms of depression.  We also referred her to Judithe Modest because of issues.  She had when she was a child with her mother.  She seemed to be doing well going to counseling taking her medication until about a month ago.  She came in here and was extremely depressed.  She was accompanied by a friend.  She ultimately was referred to behavioral health and then to Dr. Tomasa Rand.  He diagnosed her to have bipolar depression and start her on Lamictal 200 mg.  Daily, and Abilify 15 mg daily, and Ambien nightly p.r.n., and stopped her Celexa.  She states she feels 100% better.  She takes her BCPs.     Review of Systems  Constitutional: Negative.   HENT: Negative.   Eyes: Negative.   Respiratory: Negative.   Cardiovascular: Negative.   Gastrointestinal: Negative.   Genitourinary: Negative.   Musculoskeletal: Negative.   Neurological: Negative.   Hematological: Negative.   Psychiatric/Behavioral: Negative.        Bipolar depression, diagnosed September 2012       Objective:   Physical Exam  Constitutional: She appears well-developed and well-nourished.  HENT:  Head: Normocephalic and atraumatic.  Right Ear: External ear normal.  Left Ear: External ear normal.  Nose: Nose normal.  Mouth/Throat: Oropharynx is clear and moist.  Eyes: EOM are normal. Pupils are equal, round, and reactive to light.  Neck: Normal range of motion. Neck supple. No thyromegaly present.  Cardiovascular: Normal rate, regular rhythm, normal heart sounds and intact distal pulses.  Exam reveals no gallop and no friction rub.   No murmur heard. Pulmonary/Chest: Effort normal and breath sounds normal.  Abdominal: Soft. Bowel sounds are normal. She  exhibits no distension and no mass. There is no tenderness. There is no rebound.  Genitourinary: Vagina normal and uterus normal. Guaiac negative stool. No vaginal discharge found.       Bilateral breast exam shows the breast to be symmetrical.  There are diffuse fibrocystic changes throughout both breasts.  The most prominent on the right and left 12 o'clock position about an inch above the nipple.  Mammogram today was reported to be normal  Musculoskeletal: Normal range of motion.  Lymphadenopathy:    She has no cervical adenopathy.  Neurological: She is alert. She has normal reflexes. No cranial nerve deficit. She exhibits normal muscle tone. Coordination normal.  Skin: Skin is warm and dry.  Psychiatric: She has a normal mood and affect. Her behavior is normal. Judgment and thought content normal.          Assessment & Plan:  Healthy female.  History of fibrocystic breast changes.  Continue with monthly BSE and annual mammography.  Recent diagnosis of bipolar depression much improved with Abilify 15 mg daily and will make to 200 mg daily continue follow-up by Dr. Tomasa Rand

## 2011-05-02 ENCOUNTER — Other Ambulatory Visit: Payer: Self-pay | Admitting: Family Medicine

## 2011-05-02 DIAGNOSIS — R928 Other abnormal and inconclusive findings on diagnostic imaging of breast: Secondary | ICD-10-CM

## 2011-05-08 HISTORY — PX: BREAST BIOPSY: SHX20

## 2011-05-21 ENCOUNTER — Ambulatory Visit
Admission: RE | Admit: 2011-05-21 | Discharge: 2011-05-21 | Disposition: A | Discharge status: Home / Self Care | Payer: BC Managed Care – PPO | Source: Ambulatory Visit | Attending: Family Medicine | Admitting: Family Medicine

## 2011-05-21 ENCOUNTER — Other Ambulatory Visit: Payer: Self-pay | Admitting: Family Medicine

## 2011-05-21 ENCOUNTER — Other Ambulatory Visit: Payer: Self-pay | Admitting: Diagnostic Radiology

## 2011-05-21 DIAGNOSIS — R928 Other abnormal and inconclusive findings on diagnostic imaging of breast: Secondary | ICD-10-CM

## 2011-06-02 ENCOUNTER — Telehealth: Payer: Self-pay | Admitting: Family Medicine

## 2011-06-02 NOTE — Telephone Encounter (Signed)
Pt said that she was recently dx with bi-polar disorder. Req 2nd opion and would like a referral to psychiatrist from Dr Tawanna Cooler. Pt said that Dr Tawanna Cooler mentioned that he had a friend that is a Therapist, sports.

## 2011-06-03 NOTE — Telephone Encounter (Signed)
Spoke with patient.  She had a medication change and she is going to try that for a while.

## 2011-06-05 ENCOUNTER — Ambulatory Visit (INDEPENDENT_AMBULATORY_CARE_PROVIDER_SITE_OTHER): Payer: Self-pay | Admitting: Surgery

## 2011-06-05 ENCOUNTER — Other Ambulatory Visit (INDEPENDENT_AMBULATORY_CARE_PROVIDER_SITE_OTHER): Payer: Self-pay | Admitting: Surgery

## 2011-06-05 ENCOUNTER — Encounter (INDEPENDENT_AMBULATORY_CARE_PROVIDER_SITE_OTHER): Payer: Self-pay | Admitting: Surgery

## 2011-06-05 ENCOUNTER — Encounter (HOSPITAL_COMMUNITY): Payer: Self-pay | Admitting: Pharmacy Technician

## 2011-06-05 VITALS — BP 132/88 | HR 72 | Temp 97.2°F | Resp 18 | Ht 64.25 in | Wt 157.1 lb

## 2011-06-05 DIAGNOSIS — D249 Benign neoplasm of unspecified breast: Secondary | ICD-10-CM

## 2011-06-05 NOTE — Progress Notes (Signed)
Patient ID: Erin [name], female   DOB: [birth year], 45 y.o.   MRN: [id number]  Chief Complaint  Patient presents with  . New Evaluation    eval of right breast papaloma     HPI Erin [name] is a 45 y.o. female.  Referred by Dr. Kelle Darting for evaluation of intraductal papilloma right breast  HPI The patient recently underwent a routine screening mammogram. She had a suspicious finding in her right breast and underwent repeat views and an ultrasound. A 1.7 x 1.2 cm complex cyst with some solid elements was noted in the right breast at 8:00 located 8 cm from the right nipple. 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  The patient denies any breast symptoms. She denies any nipple drainage or discharge. No family history of breast cancer.  Menarche age 51 first pregnancy age 86 which resulted in abortion. Last measured. This 2 weeks ago. She has been on oral contraceptives for about 7 years. Past Medical History  Diagnosis Date  . Sickle cell anemia     trait  Bipolar disorder  Past Surgical History  Procedure Date  . Wisdom tooth extraction   . Toe surgery july 2009    broken toe    History reviewed. No pertinent family history.  Social History History  Substance Use Topics  . Smoking status: Never Smoker   . Smokeless tobacco: Never Used  . Alcohol Use: 0.5 oz/week    0 Cans of beer, 1 Drinks containing 0.5 oz of alcohol per week    Allergies  Allergen Reactions  . Sulfamethoxazole     REACTION: hives    Current Outpatient Prescriptions  Medication Sig Dispense Refill  . aspirin 81 MG chewable tablet Chew 81 mg by mouth daily.        . drospirenone-ethinyl estradiol (GIANVI) 3-0.02 MG tablet Take 1 tablet by mouth daily.  84 tablet  3  . fish oil-omega-3 fatty acids 1000 MG capsule Take 1 g by mouth daily.        Marland Kitchen glucosamine-chondroitin 500-400 MG tablet Take 1  tablet by mouth daily.        Marland Kitchen lamoTRIgine (LAMICTAL) 200 MG tablet Take 200 mg by mouth daily.        Marland Kitchen LORazepam (ATIVAN) 0.5 MG tablet as needed.       . Multiple Vitamin (MULTIVITAMIN) tablet Take 1 tablet by mouth daily.        . QUEtiapine (SEROQUEL) 200 MG tablet Take 200 mg by mouth at bedtime.        Marland Kitchen zolpidem (AMBIEN) 10 MG tablet Take 10 mg by mouth at bedtime as needed.          Review of Systems Review of Systems ROS otherwise negative Blood pressure 132/88, pulse 72, temperature 97.2 F (36.2 C), temperature source Temporal, resp. rate 18, height 5' 4.25" (1.632 m), weight 157 lb 2 oz (71.271 kg).  Physical Exam Physical Exam WDWN in NAD HEENT:  EOMI, sclera anicteric Neck:  No masses, no thyromegaly Lungs:  CTA bilaterally; normal respiratory effort Breasts - symmetric, no nipple retraction or discharge; bilateral fibrocystic changes; no lymphadenopathy; no dominant masses CV:  Regular rate and rhythm; no murmurs Abd:  +bowel sounds, soft, non-tender, no masses Ext:  Well-perfused; no edema Skin:  Warm, dry; no sign of jaundice  Data Reviewed Mammograms and ultrasound  Assessment    Right breast intraductal papilloma  Plan    Recommend right needle-localized lumpectomy for definitive diagnosis.  The surgical procedure has been discussed with the patient.  Potential risks, benefits, alternative treatments, and expected outcomes have been explained.  All of the patient's questions at this time have been answered.  The likelihood of reaching the patient's treatment goal is good.  The patient understand the proposed surgical procedure and wishes to proceed.        Ricke Kimoto K. 06/05/2011, 10:05 AM

## 2011-06-05 NOTE — Patient Instructions (Signed)
We will schedule your surgery today. 

## 2011-06-06 ENCOUNTER — Other Ambulatory Visit (INDEPENDENT_AMBULATORY_CARE_PROVIDER_SITE_OTHER): Payer: Self-pay | Admitting: General Surgery

## 2011-06-06 ENCOUNTER — Other Ambulatory Visit (INDEPENDENT_AMBULATORY_CARE_PROVIDER_SITE_OTHER): Payer: Self-pay | Admitting: Surgery

## 2011-06-06 DIAGNOSIS — N6310 Unspecified lump in the right breast, unspecified quadrant: Secondary | ICD-10-CM

## 2011-06-06 DIAGNOSIS — C50911 Malignant neoplasm of unspecified site of right female breast: Secondary | ICD-10-CM

## 2011-06-09 ENCOUNTER — Encounter (HOSPITAL_COMMUNITY): Payer: Self-pay | Admitting: *Deleted

## 2011-06-09 MED ORDER — CEFAZOLIN SODIUM-DEXTROSE 2-3 GM-% IV SOLR
2.0000 g | INTRAVENOUS | Status: DC
  Filled 2011-06-09: qty 50

## 2011-06-10 ENCOUNTER — Encounter (HOSPITAL_COMMUNITY): Payer: Self-pay | Admitting: Anesthesiology

## 2011-06-10 ENCOUNTER — Ambulatory Visit (HOSPITAL_COMMUNITY)
Admission: RE | Admit: 2011-06-10 | Discharge: 2011-06-10 | Disposition: A | Discharge status: Home / Self Care | Payer: BC Managed Care – PPO | Source: Ambulatory Visit | Attending: Surgery | Admitting: Surgery

## 2011-06-10 ENCOUNTER — Encounter (HOSPITAL_COMMUNITY)
Admission: RE | Disposition: A | Discharge status: Home / Self Care | Payer: Self-pay | Source: Ambulatory Visit | Attending: Surgery

## 2011-06-10 ENCOUNTER — Ambulatory Visit
Admission: RE | Admit: 2011-06-10 | Discharge: 2011-06-10 | Disposition: A | Discharge status: Home / Self Care | Payer: BC Managed Care – PPO | Source: Ambulatory Visit | Attending: Surgery | Admitting: Surgery

## 2011-06-10 ENCOUNTER — Ambulatory Visit (HOSPITAL_COMMUNITY): Payer: BC Managed Care – PPO | Admitting: Anesthesiology

## 2011-06-10 ENCOUNTER — Other Ambulatory Visit (INDEPENDENT_AMBULATORY_CARE_PROVIDER_SITE_OTHER): Payer: Self-pay | Admitting: Surgery

## 2011-06-10 ENCOUNTER — Ambulatory Visit
Admit: 2011-06-10 | Discharge: 2011-06-10 | Disposition: A | Discharge status: Home / Self Care | Payer: BC Managed Care – PPO | Attending: Surgery | Admitting: Surgery

## 2011-06-10 DIAGNOSIS — N6310 Unspecified lump in the right breast, unspecified quadrant: Secondary | ICD-10-CM

## 2011-06-10 DIAGNOSIS — D249 Benign neoplasm of unspecified breast: Secondary | ICD-10-CM

## 2011-06-10 DIAGNOSIS — F319 Bipolar disorder, unspecified: Secondary | ICD-10-CM | POA: Insufficient documentation

## 2011-06-10 DIAGNOSIS — N6019 Diffuse cystic mastopathy of unspecified breast: Secondary | ICD-10-CM

## 2011-06-10 HISTORY — PX: BREAST EXCISIONAL BIOPSY: SUR124

## 2011-06-10 HISTORY — PX: BREAST BIOPSY: SHX20

## 2011-06-10 HISTORY — DX: Anxiety disorder, unspecified: F41.9

## 2011-06-10 LAB — CBC
HCT: 38.6 % (ref 36.0–46.0)
MCHC: 37.8 g/dL — ABNORMAL HIGH (ref 30.0–36.0)
MCV: 81.4 fL (ref 78.0–100.0)
RDW: 13.1 % (ref 11.5–15.5)
WBC: 11.4 10*3/uL — ABNORMAL HIGH (ref 4.0–10.5)

## 2011-06-10 SURGERY — BREAST BIOPSY WITH NEEDLE LOCALIZATION
Anesthesia: General | Site: Breast | Laterality: Right | Wound class: Clean

## 2011-06-10 MED ORDER — MIDAZOLAM HCL 5 MG/5ML IJ SOLN
INTRAMUSCULAR | Status: DC | PRN
  Administered 2011-06-10: 2 mg via INTRAVENOUS

## 2011-06-10 MED ORDER — ONDANSETRON HCL 4 MG/2ML IJ SOLN
INTRAMUSCULAR | Status: DC | PRN
  Administered 2011-06-10: 4 mg via INTRAVENOUS

## 2011-06-10 MED ORDER — LACTATED RINGERS IV SOLN
INTRAVENOUS | Status: DC | PRN
  Administered 2011-06-10 (×2): via INTRAVENOUS

## 2011-06-10 MED ORDER — MEPERIDINE HCL 25 MG/ML IJ SOLN: 6.2500 mg | INTRAMUSCULAR | Status: DC | PRN

## 2011-06-10 MED ORDER — OXYCODONE-ACETAMINOPHEN 5-325 MG PO TABS: 1.0000 | ORAL_TABLET | ORAL | Status: AC | PRN

## 2011-06-10 MED ORDER — HYDROMORPHONE HCL PF 1 MG/ML IJ SOLN: 0.2500 mg | INTRAMUSCULAR | Status: DC | PRN

## 2011-06-10 MED ORDER — OXYCODONE-ACETAMINOPHEN 5-325 MG PO TABS: 1.0000 | ORAL_TABLET | ORAL | Status: DC | PRN

## 2011-06-10 MED ORDER — FENTANYL CITRATE 0.05 MG/ML IJ SOLN
INTRAMUSCULAR | Status: DC | PRN
  Administered 2011-06-10 (×2): 50 ug via INTRAVENOUS
  Administered 2011-06-10: 100 ug via INTRAVENOUS
  Administered 2011-06-10: 50 ug via INTRAVENOUS

## 2011-06-10 MED ORDER — PROPOFOL 10 MG/ML IV BOLUS
INTRAVENOUS | Status: DC | PRN
  Administered 2011-06-10: 160 mg via INTRAVENOUS

## 2011-06-10 MED ORDER — CEFAZOLIN SODIUM 1-5 GM-% IV SOLN
INTRAVENOUS | Status: DC | PRN
  Administered 2011-06-10: 2 g via INTRAVENOUS

## 2011-06-10 MED ORDER — PROMETHAZINE HCL 25 MG/ML IJ SOLN: 6.2500 mg | INTRAMUSCULAR | Status: DC | PRN

## 2011-06-10 MED ORDER — MUPIROCIN 2 % EX OINT: TOPICAL_OINTMENT | Freq: Once | CUTANEOUS | Status: DC

## 2011-06-10 MED ORDER — ONDANSETRON HCL 4 MG/2ML IJ SOLN: 4.0000 mg | INTRAMUSCULAR | Status: DC | PRN

## 2011-06-10 MED ORDER — LACTATED RINGERS IV SOLN: INTRAVENOUS | Status: DC

## 2011-06-10 MED ORDER — MUPIROCIN 2 % EX OINT
TOPICAL_OINTMENT | CUTANEOUS | Status: AC
  Filled 2011-06-10: qty 22

## 2011-06-10 MED ORDER — BUPIVACAINE-EPINEPHRINE 0.25% -1:200000 IJ SOLN
INTRAMUSCULAR | Status: DC | PRN
  Administered 2011-06-10: 9 mL

## 2011-06-10 MED ORDER — HYDROMORPHONE HCL PF 1 MG/ML IJ SOLN: 1.0000 mg | INTRAMUSCULAR | Status: DC | PRN

## 2011-06-10 MED ORDER — KETOROLAC TROMETHAMINE 30 MG/ML IJ SOLN: 15.0000 mg | Freq: Once | INTRAMUSCULAR | Status: DC | PRN

## 2011-06-10 SURGICAL SUPPLY — 50 items
APPLIER CLIP 9.375 MED OPEN (MISCELLANEOUS)
BENZOIN TINCTURE PRP APPL 2/3 (GAUZE/BANDAGES/DRESSINGS) IMPLANT
BINDER BREAST LRG (GAUZE/BANDAGES/DRESSINGS) IMPLANT
BINDER BREAST XLRG (GAUZE/BANDAGES/DRESSINGS) IMPLANT
BLADE SURG ROTATE 9660 (MISCELLANEOUS) IMPLANT
CHLORAPREP W/TINT 26ML (MISCELLANEOUS) ×2 IMPLANT
CLIP APPLIE 9.375 MED OPEN (MISCELLANEOUS) IMPLANT
CLIP TI WIDE RED SMALL 24 (CLIP) IMPLANT
CLOTH BEACON ORANGE TIMEOUT ST (SAFETY) ×2 IMPLANT
CONT SPEC 4OZ CLIKSEAL STRL BL (MISCELLANEOUS) IMPLANT
COVER PROBE W GEL 5X96 (DRAPES) IMPLANT
COVER SURGICAL LIGHT HANDLE (MISCELLANEOUS) ×2 IMPLANT
DECANTER SPIKE VIAL GLASS SM (MISCELLANEOUS) IMPLANT
DEVICE DUBIN SPECIMEN MAMMOGRA (MISCELLANEOUS) IMPLANT
DRAPE LAPAROTOMY TRNSV 102X78 (DRAPE) ×2 IMPLANT
DRAPE UTILITY 15X26 W/TAPE STR (DRAPE) ×4 IMPLANT
DRSG TEGADERM 4X4.75 (GAUZE/BANDAGES/DRESSINGS) ×2 IMPLANT
ELECT BLADE 4.0 EZ CLEAN MEGAD (MISCELLANEOUS) ×2
ELECT CAUTERY BLADE 6.4 (BLADE) ×2 IMPLANT
ELECT REM PT RETURN 9FT ADLT (ELECTROSURGICAL) ×2
ELECTRODE BLDE 4.0 EZ CLN MEGD (MISCELLANEOUS) ×1 IMPLANT
ELECTRODE REM PT RTRN 9FT ADLT (ELECTROSURGICAL) ×1 IMPLANT
GAUZE SPONGE 2X2 8PLY STRL LF (GAUZE/BANDAGES/DRESSINGS) ×1 IMPLANT
GAUZE SPONGE 4X4 16PLY XRAY LF (GAUZE/BANDAGES/DRESSINGS) ×2 IMPLANT
GLOVE BIO SURGEON STRL SZ7 (GLOVE) ×2 IMPLANT
GLOVE BIO SURGEON STRL SZ7.5 (GLOVE) ×2 IMPLANT
GLOVE BIOGEL PI IND STRL 7.5 (GLOVE) ×1 IMPLANT
GLOVE BIOGEL PI INDICATOR 7.5 (GLOVE) ×1
GLOVE SURG SS PI 6.5 STRL IVOR (GLOVE) ×2 IMPLANT
GOWN STRL NON-REIN LRG LVL3 (GOWN DISPOSABLE) ×6 IMPLANT
KIT BASIN OR (CUSTOM PROCEDURE TRAY) ×2 IMPLANT
KIT MARKER MARGIN INK (KITS) IMPLANT
KIT ROOM TURNOVER OR (KITS) ×2 IMPLANT
NEEDLE 18GX1X1/2 (RX/OR ONLY) (NEEDLE) IMPLANT
NEEDLE HYPO 25GX1X1/2 BEV (NEEDLE) ×2 IMPLANT
NS IRRIG 1000ML POUR BTL (IV SOLUTION) ×2 IMPLANT
PACK GENERAL/GYN (CUSTOM PROCEDURE TRAY) ×2 IMPLANT
PAD ARMBOARD 7.5X6 YLW CONV (MISCELLANEOUS) ×2 IMPLANT
SPECIMEN JAR MEDIUM (MISCELLANEOUS) IMPLANT
SPONGE GAUZE 2X2 STER 10/PKG (GAUZE/BANDAGES/DRESSINGS) ×1
SPONGE GAUZE 4X4 12PLY (GAUZE/BANDAGES/DRESSINGS) IMPLANT
STAPLER VISISTAT 35W (STAPLE) ×2 IMPLANT
STRIP CLOSURE SKIN 1/2X4 (GAUZE/BANDAGES/DRESSINGS) IMPLANT
SUT MNCRL AB 4-0 PS2 18 (SUTURE) ×2 IMPLANT
SUT VIC AB 3-0 SH 27 (SUTURE) ×1
SUT VIC AB 3-0 SH 27X BRD (SUTURE) ×1 IMPLANT
SYR CONTROL 10ML LL (SYRINGE) ×2 IMPLANT
TOWEL OR 17X24 6PK STRL BLUE (TOWEL DISPOSABLE) ×2 IMPLANT
TOWEL OR 17X26 10 PK STRL BLUE (TOWEL DISPOSABLE) ×2 IMPLANT
WATER STERILE IRR 1000ML POUR (IV SOLUTION) IMPLANT

## 2011-06-10 NOTE — Op Note (Signed)
Date of surgery 06/10/11  Preop diagnosis: Right intraductal papilloma  Postop diagnosis: Same  Procedure performed: Right needle localized lumpectomy  Surgeon:Bralin Garry K. Anesthesia: Gen. via LMA  Indications: This is a 45 year old female who recently underwent a routine screening mammogram. She had a suspicious finding in her right breast which was biopsied and showed intraductal sclerosing papilloma. She presents now for wide excision. A wire was placed by radiology prior to coming to the operating room. The wire calms through a inferior approach through the inframammary crease.  Description of procedure: The patient was brought to the operating room and placed in a supine position on the operating room table. After an adequate level of general anesthesia was obtained, the patient's right breast was prepped with chlor prep and draped in sterile fashion. A timeout was taken to ensure the proper patient and proper procedure. We made an elliptical incision around the wire after infiltrated with quarter percent Marcaine. We raised skin flaps in all directions. We took a cylinder of tissue around the wire in the inferior to superior direction. Our posterior margin is the chest wall. We excised the cylinder of tissue for a depth of about 4 cm. We amputated the tissue at the tip of the wire. This was oriented with a paint kit. A specimen mammogram showed that the biopsy clip was within the center of the specimen. This was sent for pathologic examination. We irrigated the wound thoroughly. We inspected carefully for hemostasis. The wound was closed with a deep layer of 3-0 Vicryl and a subcuticular layer of 4-0 Monocryl. Benzoin Steri-Strips were applied. The patient was extubated and brought to the recovery room in stable condition. All sponge, initially, and needle counts are correct.

## 2011-06-10 NOTE — Transfer of Care (Signed)
Immediate Anesthesia Transfer of Care Note  Patient: Erin [name]  Procedure(s) Performed:  BREAST BIOPSY WITH NEEDLE LOCALIZATION - Right needle localized lumpectomy  Patient Location: PACU  Anesthesia Type: General  Level of Consciousness: awake, alert  and oriented  Airway & Oxygen Therapy: Patient connected to nasal cannula oxygen  Post-op Assessment: Report given to PACU RN and Post -op Vital signs reviewed and stable  Post vital signs: Reviewed and stable  Complications: No apparent anesthesia complications

## 2011-06-10 NOTE — H&P (View-Only) (Signed)
Patient ID: Erin [name], female   DOB: 10/10/1965, 45 y.o.   MRN: [id number]  Chief Complaint  Patient presents with  . New Evaluation    eval of right breast papaloma     HPI Erin [name] is a 45 y.o. female.  Referred by Dr. Jeffrey Todd for evaluation of intraductal papilloma right breast  HPI The patient recently underwent a routine screening mammogram. She had a suspicious finding in her right breast and underwent repeat views and an ultrasound. A 1.7 x 1.2 cm complex cyst with some solid elements was noted in the right breast at 8:00 located 8 cm from the right nipple. 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  The patient denies any breast symptoms. She denies any nipple drainage or discharge. No family history of breast cancer.  Menarche age 13 first pregnancy age 17 which resulted in abortion. Last measured. This 2 weeks ago. She has been on oral contraceptives for about 7 years. Past Medical History  Diagnosis Date  . Sickle cell anemia     trait  Bipolar disorder  Past Surgical History  Procedure Date  . Wisdom tooth extraction   . Toe surgery july 2009    broken toe    History reviewed. No pertinent family history.  Social History History  Substance Use Topics  . Smoking status: Never Smoker   . Smokeless tobacco: Never Used  . Alcohol Use: 0.5 oz/week    0 Cans of beer, 1 Drinks containing 0.5 oz of alcohol per week    Allergies  Allergen Reactions  . Sulfamethoxazole     REACTION: hives    Current Outpatient Prescriptions  Medication Sig Dispense Refill  . aspirin 81 MG chewable tablet Chew 81 mg by mouth daily.        . drospirenone-ethinyl estradiol (GIANVI) 3-0.02 MG tablet Take 1 tablet by mouth daily.  84 tablet  3  . fish oil-omega-3 fatty acids 1000 MG capsule Take 1 g by mouth daily.        . glucosamine-chondroitin 500-400 MG tablet Take 1  tablet by mouth daily.        . lamoTRIgine (LAMICTAL) 200 MG tablet Take 200 mg by mouth daily.        . LORazepam (ATIVAN) 0.5 MG tablet as needed.       . Multiple Vitamin (MULTIVITAMIN) tablet Take 1 tablet by mouth daily.        . QUEtiapine (SEROQUEL) 200 MG tablet Take 200 mg by mouth at bedtime.        . zolpidem (AMBIEN) 10 MG tablet Take 10 mg by mouth at bedtime as needed.          Review of Systems Review of Systems ROS otherwise negative Blood pressure 132/88, pulse 72, temperature 97.2 F (36.2 C), temperature source Temporal, resp. rate 18, height 5' 4.25" (1.632 m), weight 157 lb 2 oz (71.271 kg).  Physical Exam Physical Exam WDWN in NAD HEENT:  EOMI, sclera anicteric Neck:  No masses, no thyromegaly Lungs:  CTA bilaterally; normal respiratory effort Breasts - symmetric, no nipple retraction or discharge; bilateral fibrocystic changes; no lymphadenopathy; no dominant masses CV:  Regular rate and rhythm; no murmurs Abd:  +bowel sounds, soft, non-tender, no masses Ext:  Well-perfused; no edema Skin:  Warm, dry; no sign of jaundice  Data Reviewed Mammograms and ultrasound  Assessment    Right breast intraductal papilloma       Plan    Recommend right needle-localized lumpectomy for definitive diagnosis.  The surgical procedure has been discussed with the patient.  Potential risks, benefits, alternative treatments, and expected outcomes have been explained.  All of the patient's questions at this time have been answered.  The likelihood of reaching the patient's treatment goal is good.  The patient understand the proposed surgical procedure and wishes to proceed.        Jamirra Curnow K. 06/05/2011, 10:05 AM    

## 2011-06-10 NOTE — Interval H&P Note (Signed)
History and Physical Interval Note:  06/10/2011 7:41 AM  Erin [name]  has presented today for surgery, with the diagnosis of Right breast intraductal papilloma  The various methods of treatment have been discussed with the patient and family. After consideration of risks, benefits and other options for treatment, the patient has consented to  Procedure(s): RIGHT BREAST BIOPSY WITH NEEDLE LOCALIZATION as a surgical intervention .  The patients' history has been reviewed, patient examined, no change in status, stable for surgery.  I have reviewed the patients' chart and labs.  Questions were answered to the patient's satisfaction.     Arleene Settle K.

## 2011-06-10 NOTE — Anesthesia Preprocedure Evaluation (Addendum)
Anesthesia Evaluation   Patient awake    Reviewed: Allergy & Precautions, H&P , NPO status , Patient's Chart, lab work & pertinent test results  History of Anesthesia Complications Negative for: history of anesthetic complications  Airway Mallampati: I  Neck ROM: Full    Dental  (+) Teeth Intact   Pulmonary neg pulmonary ROS,  clear to auscultation        Cardiovascular Regular Normal    Neuro/Psych Negative Neurological ROS     GI/Hepatic   Endo/Other    Renal/GU      Musculoskeletal   Abdominal   Peds  Hematology   Anesthesia Other Findings   Reproductive/Obstetrics                          Anesthesia Physical Anesthesia Plan  ASA: II  Anesthesia Plan: General   Post-op Pain Management:    Induction: Intravenous  Airway Management Planned: LMA  Additional Equipment:   Intra-op Plan:   Post-operative Plan: Extubation in OR  Informed Consent: I have reviewed the patients History and Physical, chart, labs and discussed the procedure including the risks, benefits and alternatives for the proposed anesthesia with the patient or authorized representative who has indicated his/her understanding and acceptance.   Dental advisory given  Plan Discussed with: CRNA and Surgeon  Anesthesia Plan Comments:         Anesthesia Quick Evaluation

## 2011-06-11 NOTE — Anesthesia Postprocedure Evaluation (Signed)
  Anesthesia Post-op Note  Patient: Erin [name]  Procedure(s) Performed:  BREAST BIOPSY WITH NEEDLE LOCALIZATION - Right needle localized lumpectomy  Patient Location: PACU  Anesthesia Type: General  Level of Consciousness: awake  Airway and Oxygen Therapy: Patient Spontanous Breathing  Post-op Pain: none  Post-op Assessment: Post-op Vital signs reviewed  Post-op Vital Signs: stable  Complications: No apparent anesthesia complications

## 2011-06-12 ENCOUNTER — Encounter (HOSPITAL_COMMUNITY): Payer: Self-pay | Admitting: Surgery

## 2011-06-12 NOTE — Progress Notes (Signed)
Quick Note:  Please call the patient and let them know that their path shows no sign of cancer. ______

## 2011-06-16 ENCOUNTER — Telehealth: Payer: Self-pay | Admitting: Family Medicine

## 2011-06-16 MED ORDER — CITALOPRAM HYDROBROMIDE 40 MG PO TABS: 40.0000 mg | ORAL_TABLET | Freq: Every day | ORAL | Status: DC

## 2011-06-16 NOTE — Telephone Encounter (Signed)
Pt says that she is taking 40mg  dose of Celexa. Pls call in to [facility] on [address].

## 2011-06-16 NOTE — Telephone Encounter (Signed)
Pt requesting you contact her before you send in rx

## 2011-06-16 NOTE — Telephone Encounter (Signed)
Left message on machine for dosage of celexa taking

## 2011-06-16 NOTE — Telephone Encounter (Signed)
Pt was on generic celexa requesting new rx call into [facility] [address] [phone]

## 2011-06-16 NOTE — Telephone Encounter (Signed)
Fleet Contras please call N100 tablets of these Celexa directions one nightly refills x 3

## 2011-06-16 NOTE — Telephone Encounter (Signed)
rx sent and patient is aware 

## 2011-07-03 ENCOUNTER — Telehealth: Payer: Self-pay | Admitting: Family Medicine

## 2011-07-03 NOTE — Telephone Encounter (Signed)
Spoke with patient.

## 2011-07-03 NOTE — Telephone Encounter (Signed)
Pt is req to get a referral or recommendation for an obgyn re: hormones and menopause.

## 2011-07-03 NOTE — Telephone Encounter (Signed)
Pls advise.  

## 2011-07-03 NOTE — Telephone Encounter (Signed)
I would recommend Dr. Ilda Mori.  GYN.......... She can call and make her own appointment.  Just tell her to use our name as a referral source

## 2011-07-10 ENCOUNTER — Encounter (INDEPENDENT_AMBULATORY_CARE_PROVIDER_SITE_OTHER): Payer: BC Managed Care – PPO | Admitting: Surgery

## 2011-07-15 ENCOUNTER — Ambulatory Visit (HOSPITAL_COMMUNITY): Admission: RE | Admit: 2011-07-15 | Payer: BC Managed Care – PPO | Source: Home / Self Care | Admitting: Psychiatry

## 2011-07-15 ENCOUNTER — Ambulatory Visit (INDEPENDENT_AMBULATORY_CARE_PROVIDER_SITE_OTHER): Payer: BC Managed Care – PPO | Admitting: Family Medicine

## 2011-07-15 ENCOUNTER — Encounter: Payer: Self-pay | Admitting: Family Medicine

## 2011-07-15 DIAGNOSIS — F32A Depression, unspecified: Secondary | ICD-10-CM

## 2011-07-15 DIAGNOSIS — F341 Dysthymic disorder: Secondary | ICD-10-CM

## 2011-07-15 DIAGNOSIS — F329 Major depressive disorder, single episode, unspecified: Secondary | ICD-10-CM

## 2011-07-15 DIAGNOSIS — R000 Tachycardia, unspecified: Secondary | ICD-10-CM

## 2011-07-15 DIAGNOSIS — F419 Anxiety disorder, unspecified: Secondary | ICD-10-CM | POA: Insufficient documentation

## 2011-07-15 LAB — CBC WITH DIFFERENTIAL/PLATELET
Basophils Absolute: 0.1 10*3/uL (ref 0.0–0.1)
HCT: 41.8 % (ref 36.0–46.0)
Lymphs Abs: 1.9 10*3/uL (ref 0.7–4.0)
Monocytes Relative: 3.4 % (ref 3.0–12.0)
Neutrophils Relative %: 79.5 % — ABNORMAL HIGH (ref 43.0–77.0)
Platelets: 290 10*3/uL (ref 150.0–400.0)
RDW: 13 % (ref 11.5–14.6)

## 2011-07-15 LAB — POCT URINALYSIS DIPSTICK
Bilirubin, UA: NEGATIVE
Leukocytes, UA: NEGATIVE
Nitrite, UA: NEGATIVE
pH, UA: 5.5

## 2011-07-15 LAB — BASIC METABOLIC PANEL
BUN: 7 mg/dL (ref 6–23)
Creatinine, Ser: 0.8 mg/dL (ref 0.4–1.2)
GFR: 97.22 mL/min (ref >60.00)

## 2011-07-15 LAB — T3, FREE: T3, Free: 3.1 pg/mL (ref 2.3–4.2)

## 2011-07-15 MED ORDER — ATENOLOL 25 MG PO TABS: 25.0000 mg | ORAL_TABLET | Freq: Every day | ORAL | Status: DC

## 2011-07-15 NOTE — Progress Notes (Signed)
  Subjective:    Patient ID: Erin [name], female    DOB: 1965-07-24, 46 y.o.   MRN: [id number]  HPITeresa is a 46 year old single female, who comes in today accompanied by her girlfriend because of anxiety.  She states she can get an appointment to see her psychiatrist, Dr. Dub Mikes until Thursday when she is having symptoms of increased anxiety for the past 6 weeks.  She has multiple physical symptoms including nausea, rapid heart rate, et Karie Soda.  She is also seen her gynecologist, and they think she is premenopausal.  They started her on Prozac, but that medication has not helped.    Review of Systems Cardiovascular his systems otherwise negative    Objective:   Physical Exam Anxious female, crying lying on the examining table.  Heart and lungs are normal except for rapid heart rate.  EKG pending       Assessment & Plan:  I think a lot of her symptoms are related to underlying anxiety.  However, will check her thyroid studies to be, sure it's not a thyroid problem.  A

## 2011-07-15 NOTE — Patient Instructions (Signed)
Begin the Tenormin, one tablet daily, to decrease her rapid heart rate.  If you feel you cannot wait until Thursday to see her psychiatrist.  I [facility] recommend [facility]  We will call you tomorrow about the results of your lab work

## 2011-07-16 ENCOUNTER — Telehealth: Payer: Self-pay | Admitting: Family Medicine

## 2011-07-16 NOTE — Telephone Encounter (Signed)
Left message on machine for patient

## 2011-07-16 NOTE — Telephone Encounter (Signed)
Pt would like lab results.  

## 2011-07-17 ENCOUNTER — Emergency Department (HOSPITAL_COMMUNITY): Payer: BC Managed Care – PPO

## 2011-07-17 ENCOUNTER — Emergency Department (HOSPITAL_COMMUNITY)
Admission: EM | Admit: 2011-07-17 | Discharge: 2011-07-17 | Disposition: A | Discharge status: Home / Self Care | Payer: BC Managed Care – PPO | Attending: Emergency Medicine | Admitting: Emergency Medicine

## 2011-07-17 ENCOUNTER — Encounter (HOSPITAL_COMMUNITY): Payer: Self-pay

## 2011-07-17 ENCOUNTER — Other Ambulatory Visit: Payer: Self-pay

## 2011-07-17 DIAGNOSIS — R002 Palpitations: Secondary | ICD-10-CM

## 2011-07-17 DIAGNOSIS — R110 Nausea: Secondary | ICD-10-CM | POA: Insufficient documentation

## 2011-07-17 DIAGNOSIS — F411 Generalized anxiety disorder: Secondary | ICD-10-CM | POA: Insufficient documentation

## 2011-07-17 DIAGNOSIS — Z79899 Other long term (current) drug therapy: Secondary | ICD-10-CM | POA: Insufficient documentation

## 2011-07-17 DIAGNOSIS — F419 Anxiety disorder, unspecified: Secondary | ICD-10-CM

## 2011-07-17 DIAGNOSIS — D573 Sickle-cell trait: Secondary | ICD-10-CM | POA: Insufficient documentation

## 2011-07-17 DIAGNOSIS — Z7982 Long term (current) use of aspirin: Secondary | ICD-10-CM | POA: Insufficient documentation

## 2011-07-17 DIAGNOSIS — F329 Major depressive disorder, single episode, unspecified: Secondary | ICD-10-CM | POA: Insufficient documentation

## 2011-07-17 DIAGNOSIS — R079 Chest pain, unspecified: Secondary | ICD-10-CM | POA: Insufficient documentation

## 2011-07-17 DIAGNOSIS — F3289 Other specified depressive episodes: Secondary | ICD-10-CM | POA: Insufficient documentation

## 2011-07-17 LAB — CBC
Hemoglobin: 15.2 g/dL — ABNORMAL HIGH (ref 12.0–15.0)
MCH: 31.3 pg (ref 26.0–34.0)
MCHC: 38.7 g/dL — ABNORMAL HIGH (ref 30.0–36.0)
MCV: 81 fL (ref 78.0–100.0)
Platelets: 317 10*3/uL (ref 150–400)
RBC: 4.85 MIL/uL (ref 3.87–5.11)

## 2011-07-17 LAB — POCT I-STAT, CHEM 8
Chloride: 102 mEq/L (ref 96–112)
Creatinine, Ser: 1 mg/dL (ref 0.50–1.10)
Hemoglobin: 15.3 g/dL — ABNORMAL HIGH (ref 12.0–15.0)
Potassium: 3.9 mEq/L (ref 3.5–5.1)
Sodium: 137 mEq/L (ref 135–145)

## 2011-07-17 LAB — POCT I-STAT TROPONIN I

## 2011-07-17 MED ORDER — ASPIRIN 81 MG PO CHEW
324.0000 mg | CHEWABLE_TABLET | Freq: Once | ORAL | Status: AC
  Administered 2011-07-17: 324 mg via ORAL
  Filled 2011-07-17: qty 4

## 2011-07-17 MED ORDER — ALPRAZOLAM 0.5 MG PO TABS: 0.5000 mg | ORAL_TABLET | Freq: Three times a day (TID) | ORAL | Status: DC | PRN

## 2011-07-17 NOTE — ED Provider Notes (Signed)
History     CSN: [id number]  Arrival date & time 07/17/11  0820   First MD Initiated Contact with Patient 07/17/11 [phone]      Chief Complaint  Patient presents with  . Anxiety  . Chest Pain  . Palpitations    (Consider location/radiation/quality/duration/timing/severity/associated sxs/prior treatment) Patient is a 46 y.o. female presenting with anxiety, chest pain, and palpitations. The history is provided by the patient.  Anxiety This is a recurrent problem. Associated symptoms include chest pain and abdominal pain.  Chest Pain Primary symptoms include palpitations, abdominal pain and nausea. Pertinent negatives for primary symptoms include no cough and no vomiting.  Pertinent negatives for associated symptoms include no numbness.    Palpitations  Associated symptoms include chest pain, abdominal pain and nausea. Pertinent negatives include no numbness, no vomiting, no back pain and no cough.   patient is having episodes of palpitations and chest pain the last month and a half. She states she gets anxious with it. She seen her primary care and close anxiety. She is scheduled to followup with psychiatry. No fevers. No cough. She states her upper abdomen does hurt sometimes. She states mild nauseous. No vomiting. She states she's been eating less. No fevers.  Past Medical History  Diagnosis Date  . Sickle cell anemia     trait  . Anxiety   . Depression     situational    Past Surgical History  Procedure Date  . Wisdom tooth extraction   . Toe surgery july 2009    broken toe  . Ablation saphenous vein w/ rfa     Right leg  . Breast biopsy 06/10/2011    Procedure: BREAST BIOPSY WITH NEEDLE LOCALIZATION;  Surgeon: Wilmon Arms. Corliss Skains, MD;  Location: [facility] OR;  Service: General;  Laterality: Right;  Right needle localized lumpectomy    History reviewed. No pertinent family history.  History  Substance Use Topics  . Smoking status: Never Smoker   . Smokeless tobacco: Never  Used  . Alcohol Use: 0.5 oz/week    0 Cans of beer, 1 Drinks containing 0.5 oz of alcohol per week    OB History    Grav Para Term Preterm Abortions TAB SAB Ect Mult Living                  Review of Systems  Constitutional: Negative for chills.  HENT: Negative for congestion.   Eyes: Negative for pain.  Respiratory: Negative for cough and choking.   Cardiovascular: Positive for chest pain and palpitations.  Gastrointestinal: Positive for nausea and abdominal pain. Negative for vomiting.  Genitourinary: Negative for frequency and flank pain.  Musculoskeletal: Negative for back pain.  Neurological: Negative for numbness.  Psychiatric/Behavioral:       Anxiety    Allergies  Sulfamethoxazole  Home Medications   Current Outpatient Rx  Name Route Sig Dispense Refill  . ASPIRIN 81 MG PO CHEW Oral Chew 81 mg by mouth daily.      . ATENOLOL 25 MG PO TABS Oral Take 1 tablet (25 mg total) by mouth daily. 30 tablet 11  . DROSPIRENONE-ETHINYL ESTRADIOL 3-0.02 MG PO TABS Oral Take 1 tablet by mouth daily. 84 tablet 3  . OMEGA-3 FATTY ACIDS 1000 MG PO CAPS Oral Take 1 g by mouth daily.      Marland Kitchen FLUOXETINE HCL 20 MG PO TABS Oral Take 20 mg by mouth daily.      Marland Kitchen GLUCOSAMINE-CHONDROITIN 500-400 MG PO TABS Oral Take 1 tablet  by mouth daily.      Marland Kitchen ONE-DAILY MULTI VITAMINS PO TABS Oral Take 1 tablet by mouth daily.        BP 137/89  Pulse 75  Temp(Src) 98.2 F (36.8 C) (Oral)  Resp 20  SpO2 100%  LMP 06/28/2011  Physical Exam  Nursing note and vitals reviewed. Constitutional: She is oriented to person, place, and time. She appears well-developed and well-nourished.  HENT:  Head: Normocephalic and atraumatic.  Eyes: EOM are normal. Pupils are equal, round, and reactive to light.  Neck: Normal range of motion. Neck supple.  Cardiovascular: Normal rate, regular rhythm and normal heart sounds.   No murmur heard. Pulmonary/Chest: Effort normal and breath sounds normal. No respiratory  distress. She has no wheezes. She has no rales.  Abdominal: Soft. Bowel sounds are normal. She exhibits no distension. There is no tenderness. There is no rebound and no guarding.  Musculoskeletal: Normal range of motion.  Neurological: She is alert and oriented to person, place, and time. No cranial nerve deficit.  Skin: Skin is warm and dry.  Psychiatric: She has a normal mood and affect. Her speech is normal.    ED Course  Procedures (including critical care time)  Labs Reviewed  POCT I-STAT, CHEM 8 - Abnormal; Notable for the following:    BUN 4 (*)    Glucose, Bld 116 (*)    Hemoglobin 15.3 (*)    All other components within normal limits  POCT I-STAT TROPONIN I  CBC  I-STAT, CHEM 8  I-STAT TROPONIN I   No results found.   No diagnosis found.   Date: 07/17/2011  Rate: 82  Rhythm: normal sinus rhythm  QRS Axis: normal  Intervals: normal  ST/T Wave abnormalities: normal  Conduction Disutrbances:none  Narrative Interpretation:   Old EKG Reviewed: none available    MDM  Chest pain palpitations. To be related to anxiety. EKG and lab work reassuring. She's been seen for this previously. She'll be discharged on a short dose of benzodiazepines        Juliet Rude. Rubin Payor, MD 07/17/11 1407

## 2011-07-17 NOTE — ED Notes (Signed)
Patient states that for the past month she has been having increasing anxiety. She went to see dr. Tawanna Cooler 2 days ago and when she was there he told her that she was having some anxiety and was tachy at a rate of 120. At that point in time she was started on atenolol. She states that the pain is a constant dull achy ness in the left side of her chest. She states that she does feel anxious but not as much as she did the other day.

## 2011-07-22 ENCOUNTER — Encounter (INDEPENDENT_AMBULATORY_CARE_PROVIDER_SITE_OTHER): Payer: BC Managed Care – PPO | Admitting: Surgery

## 2011-08-05 ENCOUNTER — Ambulatory Visit (INDEPENDENT_AMBULATORY_CARE_PROVIDER_SITE_OTHER): Payer: BC Managed Care – PPO | Admitting: Surgery

## 2011-08-05 ENCOUNTER — Encounter (INDEPENDENT_AMBULATORY_CARE_PROVIDER_SITE_OTHER): Payer: BC Managed Care – PPO | Admitting: Surgery

## 2011-08-05 ENCOUNTER — Encounter (INDEPENDENT_AMBULATORY_CARE_PROVIDER_SITE_OTHER): Payer: Self-pay | Admitting: Surgery

## 2011-08-05 VITALS — BP 136/82 | HR 70 | Temp 97.9°F | Resp 18 | Ht 64.0 in | Wt 161.0 lb

## 2011-08-05 DIAGNOSIS — D249 Benign neoplasm of unspecified breast: Secondary | ICD-10-CM

## 2011-08-05 NOTE — Progress Notes (Signed)
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  The patient had to reschedule her follow-up visit a few times.  The pathology showed no sign of malignancy or atypia.  The incision is well-healed with no sign of infection.  No palpable masses   Intraductal papilloma s/p excision  Continue monthly BSE and annual mammograms  Follow-up PRN  Wilmon Arms. Corliss Skains, MD, [facility]  08/05/2011 2:50 PM

## 2011-09-05 ENCOUNTER — Encounter (HOSPITAL_COMMUNITY): Payer: Self-pay | Admitting: Emergency Medicine

## 2011-09-05 ENCOUNTER — Emergency Department (HOSPITAL_COMMUNITY)
Admission: EM | Admit: 2011-09-05 | Discharge: 2011-09-05 | Disposition: A | Discharge status: Home / Self Care | Payer: BC Managed Care – PPO | Attending: Emergency Medicine | Admitting: Emergency Medicine

## 2011-09-05 ENCOUNTER — Emergency Department (HOSPITAL_COMMUNITY): Payer: BC Managed Care – PPO

## 2011-09-05 DIAGNOSIS — F411 Generalized anxiety disorder: Secondary | ICD-10-CM | POA: Insufficient documentation

## 2011-09-05 DIAGNOSIS — S0003XA Contusion of scalp, initial encounter: Secondary | ICD-10-CM | POA: Insufficient documentation

## 2011-09-05 DIAGNOSIS — S0990XA Unspecified injury of head, initial encounter: Secondary | ICD-10-CM | POA: Insufficient documentation

## 2011-09-05 DIAGNOSIS — T07XXXA Unspecified multiple injuries, initial encounter: Secondary | ICD-10-CM

## 2011-09-05 DIAGNOSIS — IMO0002 Reserved for concepts with insufficient information to code with codable children: Secondary | ICD-10-CM | POA: Insufficient documentation

## 2011-09-05 DIAGNOSIS — D573 Sickle-cell trait: Secondary | ICD-10-CM | POA: Insufficient documentation

## 2011-09-05 MED ORDER — BACITRACIN-NEOMYCIN-POLYMYXIN OINTMENT TUBE
TOPICAL_OINTMENT | Freq: Once | CUTANEOUS | Status: AC
  Administered 2011-09-05: 1 via TOPICAL
  Filled 2011-09-05: qty 15

## 2011-09-05 MED ORDER — BACITRACIN-NEOMYCIN-POLYMYXIN 400-5-5000 EX OINT
TOPICAL_OINTMENT | CUTANEOUS | Status: AC
  Filled 2011-09-05: qty 1

## 2011-09-05 MED ORDER — LORAZEPAM 1 MG PO TABS
1.0000 mg | ORAL_TABLET | Freq: Once | ORAL | Status: AC
  Administered 2011-09-05: 1 mg via ORAL
  Filled 2011-09-05: qty 1

## 2011-09-05 NOTE — ED Notes (Signed)
Patient transported to CT 
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
## 2011-09-05 NOTE — ED Provider Notes (Signed)
Medical screening examination/treatment/procedure(s) were conducted as a shared visit with non-physician practitioner(s) and myself.  I personally evaluated the patient during the encounter patient fell and hit her face on the floor. Large hematoma. CT is negative for fracture or bleed. Discussed with patient and she'll followup as needed  Juliet Rude. Rubin Payor, MD 09/05/11 [phone]

## 2011-09-05 NOTE — ED Notes (Signed)
Abrasion to bridge of nose and under nose. Hematoma to mid forehead with abrasion to mid forehead. Pt states she is not nauseous. Denies LOC. Pt c/o slight headache.

## 2011-09-05 NOTE — Discharge Instructions (Signed)
Abrasions Abrasions are skin scrapes. Their treatment depends on how large and deep the abrasion is. Abrasions do not extend through all layers of the skin. A cut or lesion through all skin layers is called a laceration. HOME CARE INSTRUCTIONS   If you were given a dressing, change it at least once a day or as instructed by your caregiver. If the bandage sticks, soak it off with a solution of water or hydrogen peroxide.   Twice a day, wash the area with soap and water to remove all the cream/ointment. You may do this in a sink, under a tub faucet, or in a shower. Rinse off the soap and pat dry with a clean towel. Look for signs of infection (see below).   Reapply cream/ointment according to your caregiver's instruction. This will help prevent infection and keep the bandage from sticking. Telfa or gauze over the wound and under the dressing or wrap will also help keep the bandage from sticking.   If the bandage becomes wet, dirty, or develops a foul smell, change it as soon as possible.   Only take over-the-counter or prescription medicines for pain, discomfort, or fever as directed by your caregiver.  SEEK IMMEDIATE MEDICAL CARE IF:   Increasing pain in the wound.   Signs of infection develop: redness, swelling, surrounding area is tender to touch, or pus coming from the wound.   You have a fever.   Any foul smell coming from the wound or dressing.  Most skin wounds heal within ten days. Facial wounds heal faster. However, an infection may occur despite proper treatment. You should have the wound checked for signs of infection within 24 to 48 hours or sooner if problems arise. If you were not given a wound-check appointment, look closely at the wound yourself on the second day for early signs of infection listed above. MAKE SURE YOU:   Understand these instructions.   Will watch your condition.   Will get help right away if you are not doing well or get worse.  Document Released:  04/02/2005 Document Revised: 03/05/2011 Document Reviewed: 02/09/2008 [facility] Patient Information 2012 [place], [place]. Please wash her abrasions daily with soap and water and apply a thin coat of Neosporin or antibiotic ointment once a day

## 2011-09-05 NOTE — ED Notes (Signed)
[id number]<AV> Expected date:<BR> Expected time: 8:08 PM<BR> Means of arrival:<BR> Comments:<BR> M251 - 41yoF Fall, hematoma to forehead.  ambulatory

## 2011-09-11 ENCOUNTER — Ambulatory Visit: Payer: Self-pay | Admitting: Family Medicine

## 2012-03-23 ENCOUNTER — Other Ambulatory Visit: Payer: Self-pay | Admitting: Family Medicine

## 2012-03-23 DIAGNOSIS — Z1231 Encounter for screening mammogram for malignant neoplasm of breast: Secondary | ICD-10-CM

## 2012-04-14 IMAGING — MG MM DIGITAL DIAGNOSTIC UNILAT*R*
3 series · 3 of 3 positions shown · non-contrast
Comparison: None.

CLINICAL DATA: Complex cystic mass at 8 o'clock 8 cm from the
right nipple biopsied with ultrasound guidance and clip placement.

DIGITAL DIAGNOSTIC RIGHT MAMMOGRAM

[R CC]
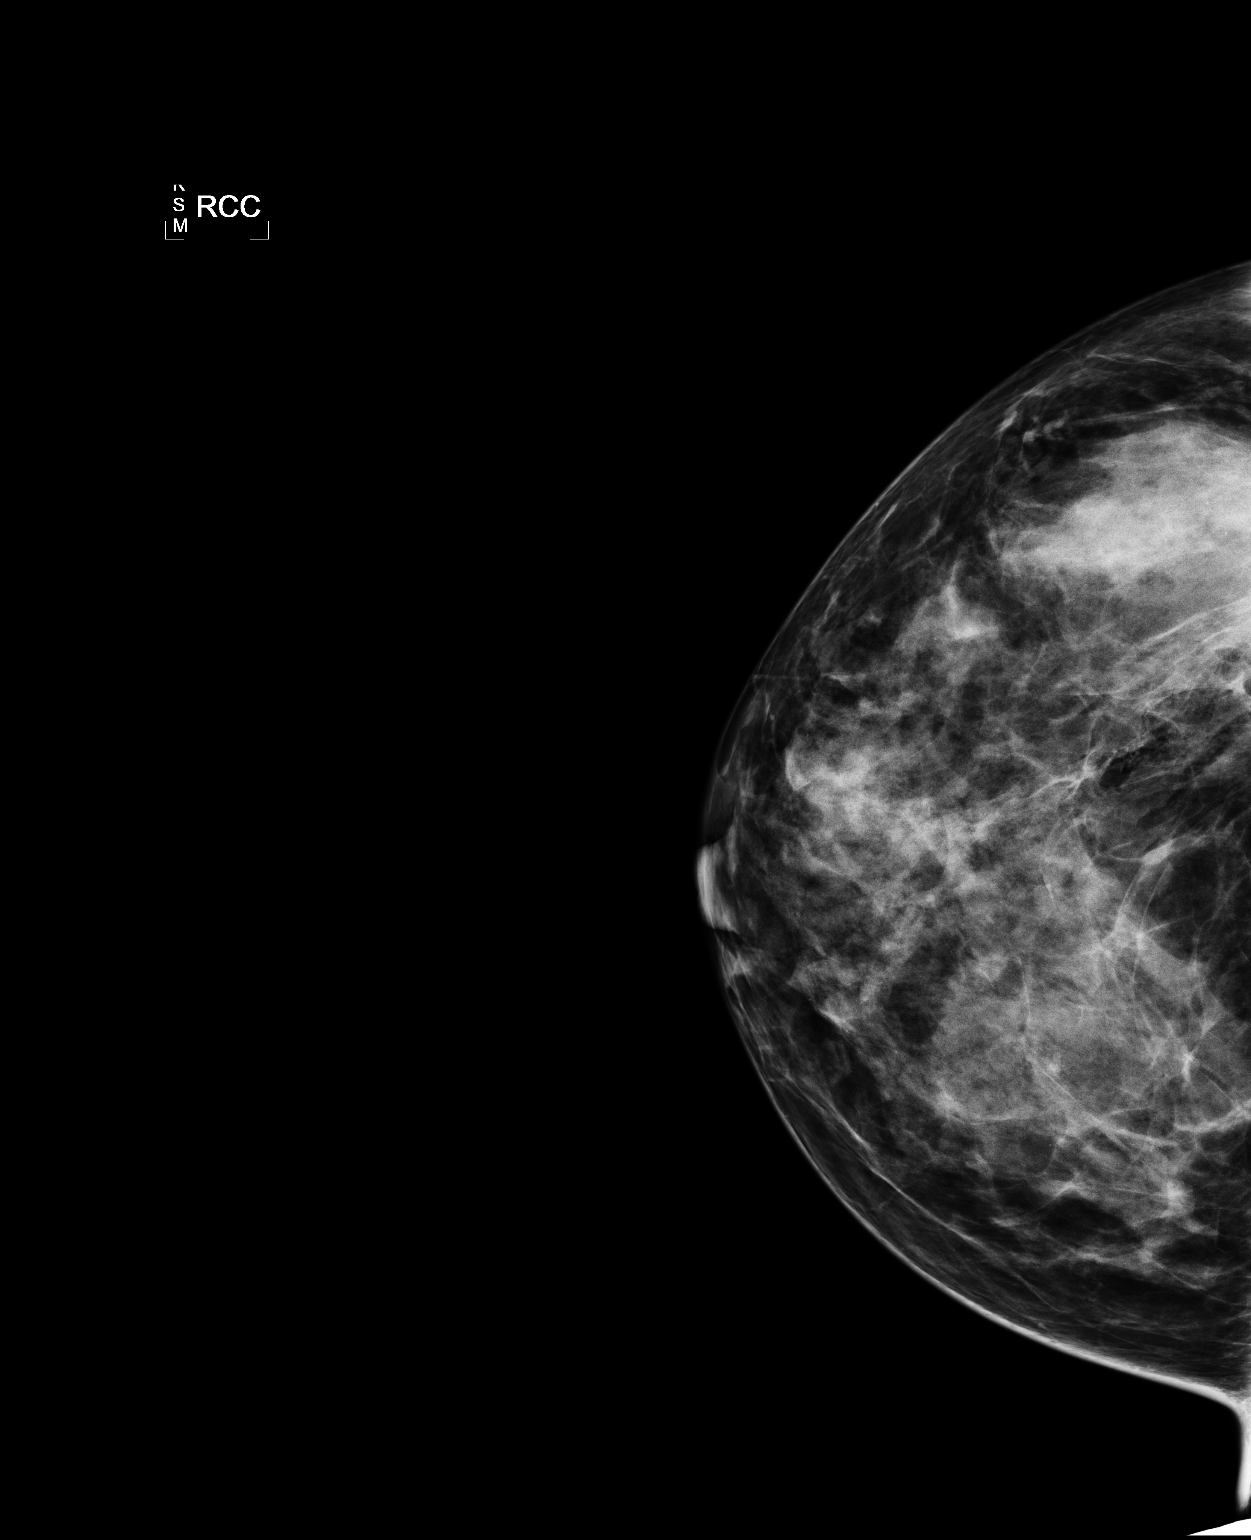

[R ML]
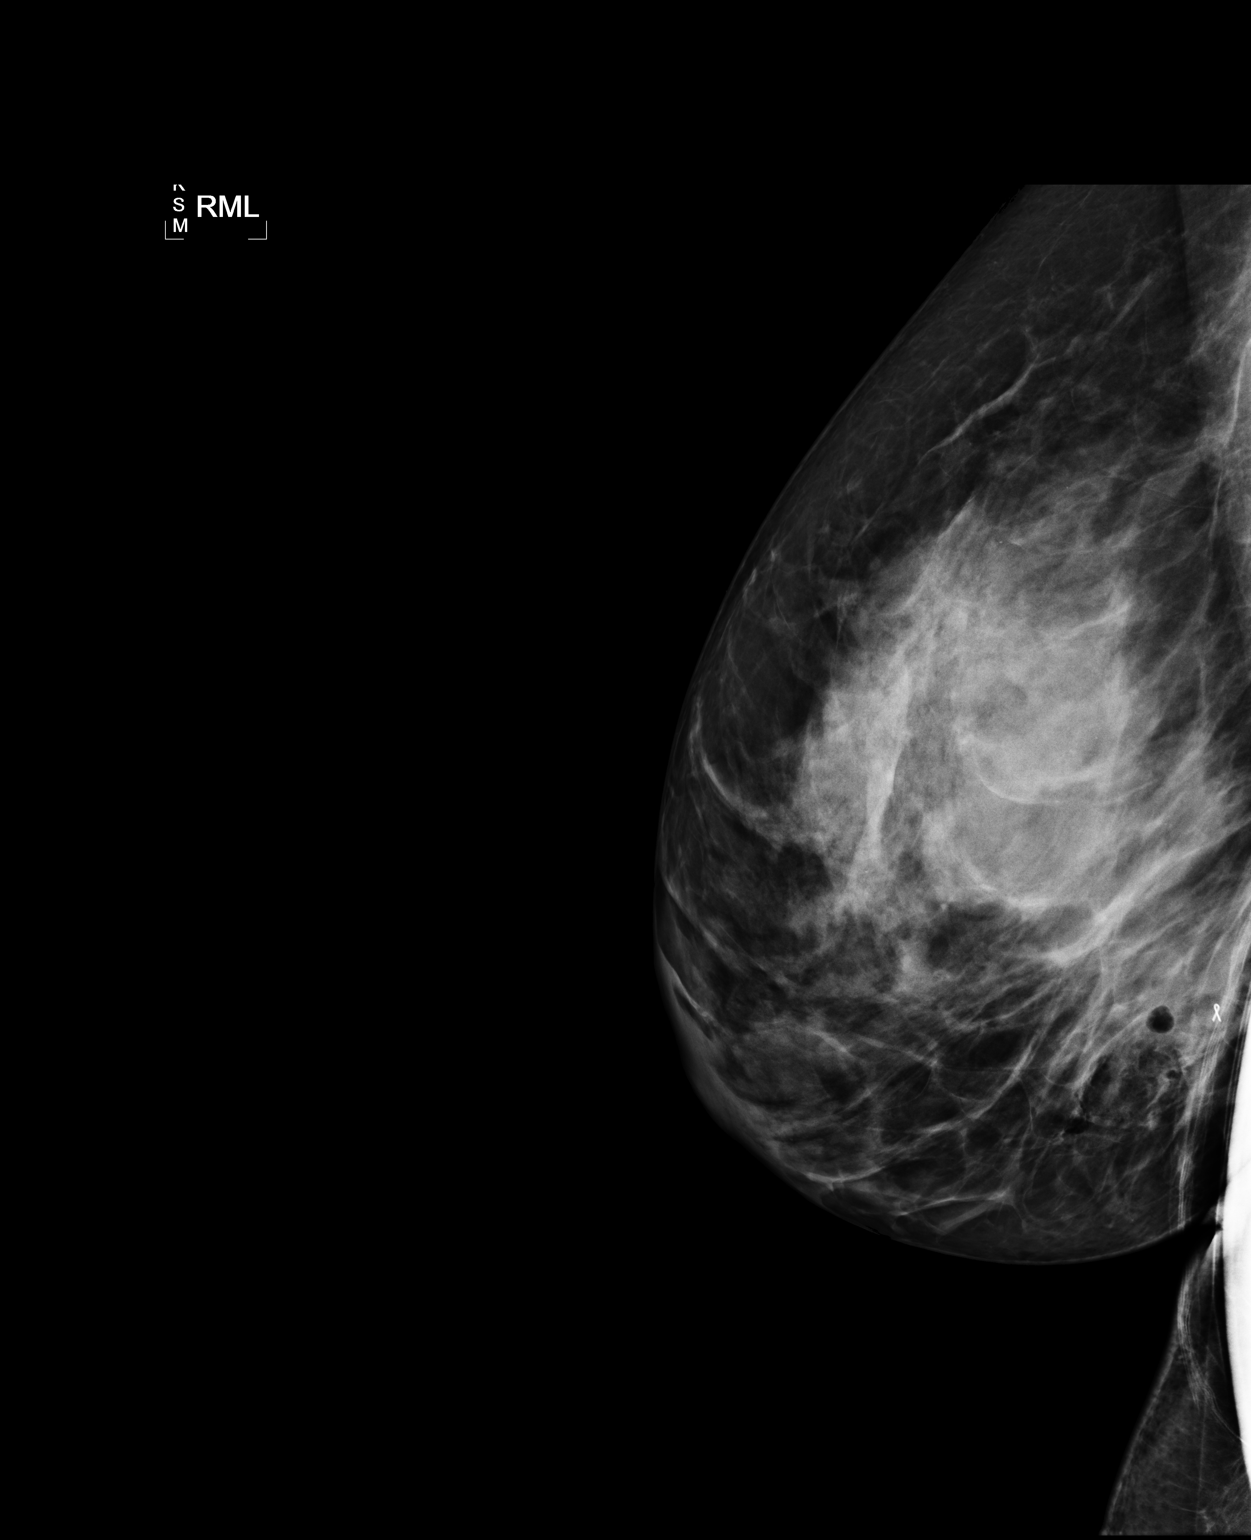

[R XCCL]
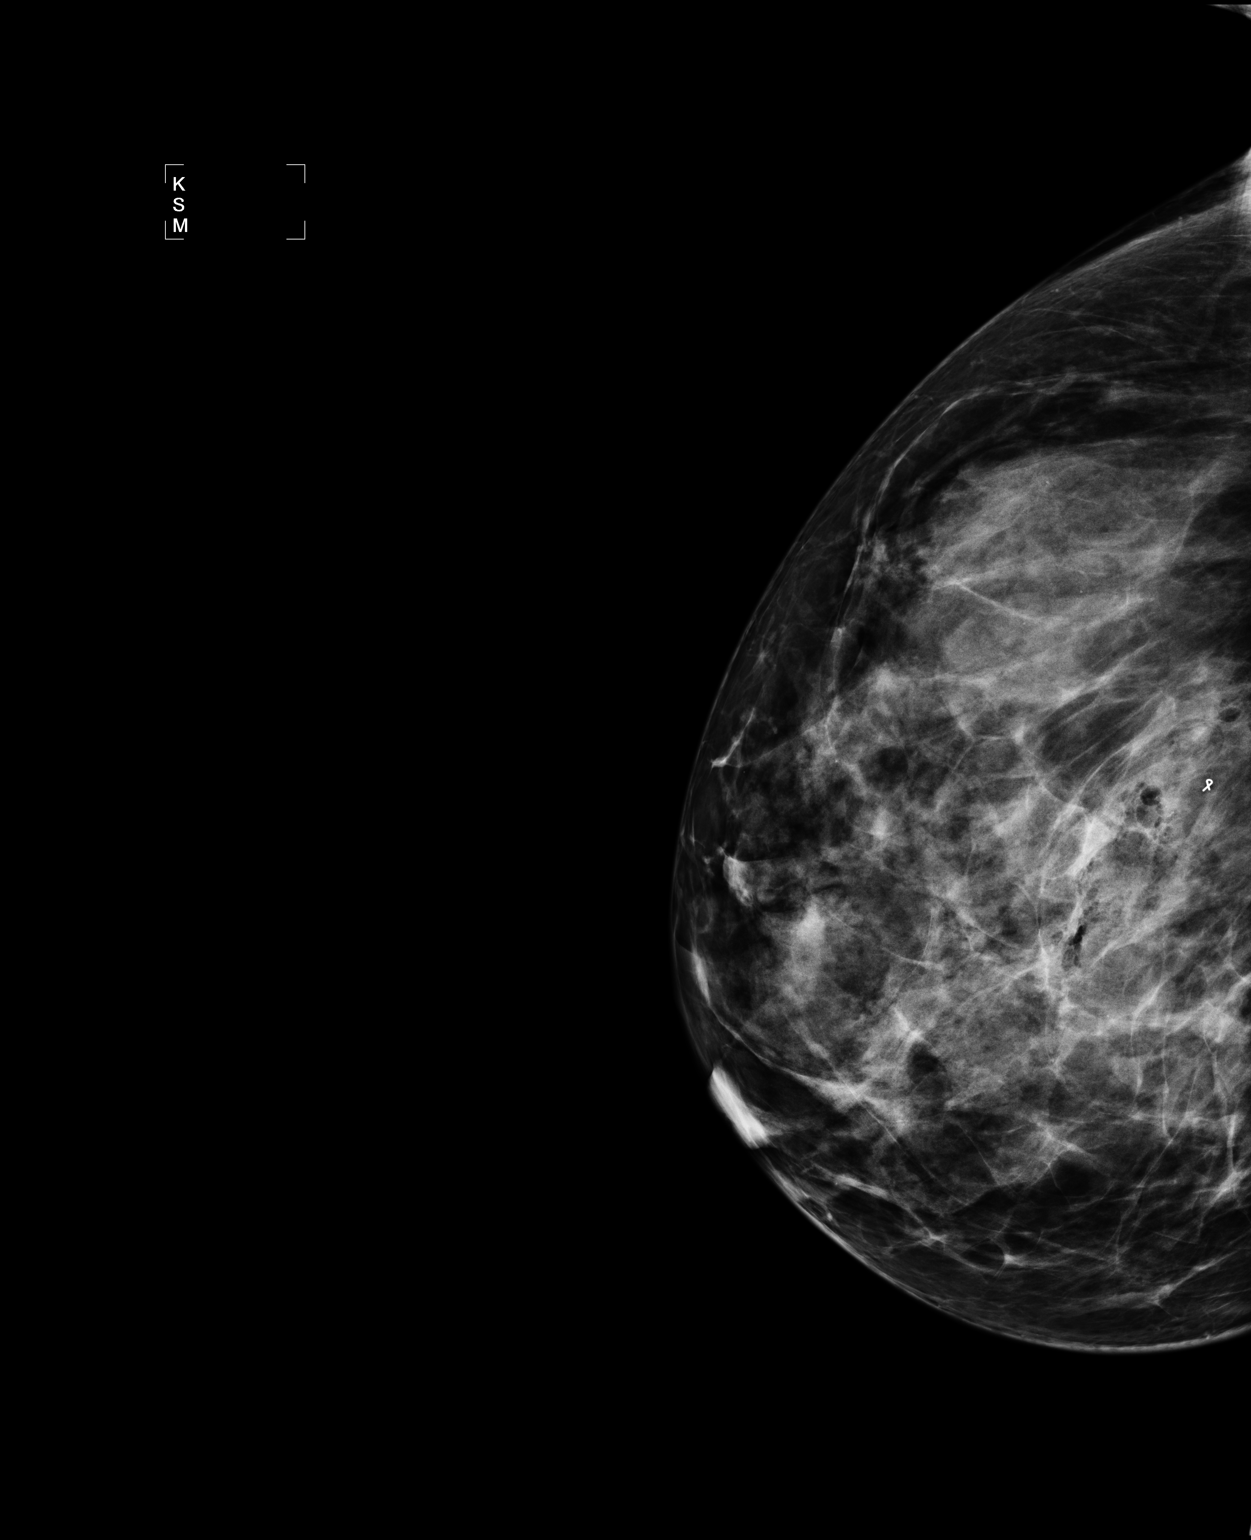

[3 of 3 positions shown; findings below may reference images not displayed]

FINDINGS: Films are performed following ultrasound guided biopsy
of a complex cystic mass at 8 o'clock 8 cm from the right nipple.
The InRad ribbon clip is appropriately positioned in the region of
the mass.
IMPRESSION: Appropriate clip placement following ultrasound-guided core needle
biopsy of a mass at 8 o'clock 8 cm from the right nipple.

## 2012-04-29 ENCOUNTER — Ambulatory Visit
Admission: RE | Admit: 2012-04-29 | Discharge: 2012-04-29 | Disposition: A | Discharge status: Home / Self Care | Payer: Self-pay | Source: Ambulatory Visit | Attending: Family Medicine | Admitting: Family Medicine

## 2012-04-29 DIAGNOSIS — Z1231 Encounter for screening mammogram for malignant neoplasm of breast: Secondary | ICD-10-CM

## 2013-03-02 ENCOUNTER — Ambulatory Visit: Payer: Self-pay | Admitting: Obstetrics & Gynecology

## 2013-03-04 ENCOUNTER — Ambulatory Visit (INDEPENDENT_AMBULATORY_CARE_PROVIDER_SITE_OTHER): Payer: 59 | Admitting: Obstetrics & Gynecology

## 2013-03-04 ENCOUNTER — Encounter: Payer: Self-pay | Admitting: Obstetrics & Gynecology

## 2013-03-04 ENCOUNTER — Telehealth: Payer: Self-pay | Admitting: Obstetrics & Gynecology

## 2013-03-04 VITALS — BP 138/80 | HR 80 | Resp 16 | Ht 64.5 in | Wt 155.0 lb

## 2013-03-04 DIAGNOSIS — Z01419 Encounter for gynecological examination (general) (routine) without abnormal findings: Secondary | ICD-10-CM

## 2013-03-04 DIAGNOSIS — D260 Other benign neoplasm of cervix uteri: Secondary | ICD-10-CM

## 2013-03-04 DIAGNOSIS — Z124 Encounter for screening for malignant neoplasm of cervix: Secondary | ICD-10-CM

## 2013-03-04 DIAGNOSIS — Z0000 Encounter for general adult medical examination without abnormal findings: Secondary | ICD-10-CM

## 2013-03-04 DIAGNOSIS — N951 Menopausal and female climacteric states: Secondary | ICD-10-CM

## 2013-03-04 DIAGNOSIS — D259 Leiomyoma of uterus, unspecified: Secondary | ICD-10-CM

## 2013-03-04 LAB — POCT URINALYSIS DIPSTICK
Bilirubin, UA: NEGATIVE
Leukocytes, UA: NEGATIVE
Nitrite, UA: NEGATIVE
Protein, UA: NEGATIVE
pH, UA: 6.5

## 2013-03-04 MED ORDER — DROSPIRENONE-ETHINYL ESTRADIOL 3-0.02 MG PO TABS: 1.0000 | ORAL_TABLET | Freq: Every day | ORAL | Status: DC

## 2013-03-04 NOTE — Telephone Encounter (Signed)
Per visit note today with SM, pt to restart Gianvi. Rx sent to [facility] on [address] via [employer].

## 2013-03-04 NOTE — Progress Notes (Signed)
47 y.o. G1P0 SingleAfrican AmericanF here for annual exam.  Tried cutting her Combipatch in 1/2 and then 1/4 and then she stopped.  She reports she was under a tremendous amount of stress at that time.  Now she is taking much better care of herself.  Boyfriend gone.  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  Job is kind of boring.  Cycles are regular, lasting 5 days.  Heavy and cramping for 1-2 days.  Was on Gianvi .  Would like to restart. Having a lot more cramping this year as well and some increased discharge.  Patient's last menstrual period was 02/04/2013.          Sexually active: no  The current method of family planning is none.    Exercising: yes  cardio and weights at least 2-3 x weekly Smoker:  no  Health Maintenance: Pap:  10/12 History of abnormal Pap:  no MMG:  04/29/12 3D normal Colonoscopy:  none BMD:   none TDaP:  2010 Screening Labs: 10/12, Hb today: done today, Urine today: PH 6.5   reports that she has never smoked. She has never used smokeless tobacco. She reports that  drinks alcohol. She reports that she does not use illicit drugs.  Past Medical History  Diagnosis Date  . Sickle cell anemia     trait  . Anxiety   . Depression     situational  . Headache     menstrual    Past Surgical History  Procedure Laterality Date  . Wisdom tooth extraction    . Toe surgery  july 2009    broken toe  . Ablation saphenous vein w/ rfa      Right leg  . Breast biopsy  06/10/2011    Procedure: BREAST BIOPSY WITH NEEDLE LOCALIZATION;  Surgeon: Wilmon Arms. Corliss Skains, MD;  Location: [facility] OR;  Service: General;  Laterality: Right;  Right needle localized lumpectomy    Current Outpatient Prescriptions  Medication Sig Dispense Refill  . buPROPion (WELLBUTRIN XL) 300 MG 24 hr tablet Take 300 mg by mouth daily.      . busPIRone (BUSPAR) 15 MG tablet Take 15 mg by mouth 2 (two) times daily.       . fish oil-omega-3 fatty acids 1000 MG capsule Take 1 g by mouth  daily.        Marland Kitchen glucosamine-chondroitin 500-400 MG tablet Take 1 tablet by mouth daily.        . Multiple Vitamin (MULTIVITAMIN) tablet Take 1 tablet by mouth daily.        Marland Kitchen OVER THE COUNTER MEDICATION Pm phytogen complex (pueraria mirifica)      . aspirin 81 MG chewable tablet Chew 81 mg by mouth daily.        . drospirenone-ethinyl estradiol (GIANVI) 3-0.02 MG tablet Take 1 tablet by mouth daily.  84 tablet  3   No current facility-administered medications for this visit.    Family History  Problem Relation Age of Onset  . Hypertension Mother   . Heart disease Maternal Grandmother   . Anxiety disorder Mother   . Tuberculosis Father     ?/not sure  . Hypertension Sister     ROS:  Pertinent items are noted in HPI.  Otherwise, a comprehensive ROS was negative.  Exam:   BP 138/80  Pulse 80  Resp 16  Ht 5' 4.5" (1.638 m)  Wt 155 lb (70.308 kg)  BMI 26.2 kg/m2  LMP 02/04/2013  Weight change: -5lbs  Height: 5' 4.5" (163.8 cm)  Ht Readings from Last 3 Encounters:  03/04/13 5' 4.5" (1.638 m)  08/05/11 5\' 4"  (1.626 m)  06/10/11 5\' 4"  (1.626 m)    General appearance: alert, cooperative and appears stated age Head: Normocephalic, without obvious abnormality, atraumatic Neck: no adenopathy, supple, symmetrical, trachea midline and thyroid normal to inspection and palpation Lungs: clear to auscultation bilaterally Breasts: normal appearance, no masses or tenderness Heart: regular rate and rhythm Abdomen: soft, non-tender; bowel sounds normal; no masses,  no organomegaly Extremities: extremities normal, atraumatic, no cyanosis or edema Skin: Skin color, texture, turgor normal. No rashes or lesions Lymph nodes: Cervical, supraclavicular, and axillary nodes normal. No abnormal inguinal nodes palpated Neurologic: Grossly normal   Pelvic: External genitalia:  no lesions              Urethra:  normal appearing urethra with no masses, tenderness or lesions              Bartholins  and Skenes: normal                 Vagina: normal appearing vagina with normal color and discharge, no lesions              Cervix: 3cm fibroid prolapsing through cervix              Pap taken: yes Bimanual Exam:  Uterus:  normal size, contour, position, consistency, mobility, non-tender              Adnexa: normal adnexa and no mass, fullness, tenderness               Rectovaginal: Confirms               Anus:  normal sphincter tone, no lesions  A:  Well Woman with normal exam H/O emotional changes last year that were hormonally related, improved now Increased cramping Prolapsing cervical fibroid  P:   Mammogram yearly.  D/W pt 3D due to dense breasts Return for PUS, possible SHG to assess base of fibroid FSH, estradiol today pap smear with HR HPV today Restart Gianvi.  Rx to pharmacy.  Risks including but not limited to headache, DVT, PE, nausea, elevated BP discussed. return annually or prn  An After Visit Summary was printed and given to the patient.

## 2013-03-04 NOTE — Telephone Encounter (Signed)
[facility] on [phone] Patient was seen today by Dr. Hyacinth Meeker but her pharmacy does not have the RX for Gianvi. Please re-send for patient.

## 2013-03-08 ENCOUNTER — Telehealth: Payer: Self-pay | Admitting: Obstetrics & Gynecology

## 2013-03-08 ENCOUNTER — Telehealth: Payer: Self-pay

## 2013-03-08 NOTE — Telephone Encounter (Signed)
Message copied by Elisha Headland on Tue Mar 08, 2013 11:32 AM ------      Message from: Jerene Bears      Created: Sat Mar 05, 2013  2:44 PM       Inform [facility] is in normal pre-menopausal range.  Estrogen level is a little low but as she is doing well off the Combi patch, I do not think she should restart it.  She should be scheduled for PUS due to fibroid prolapsing through cervix. ------

## 2013-03-08 NOTE — Telephone Encounter (Signed)
Patient notified of all results. 

## 2013-03-08 NOTE — Telephone Encounter (Signed)
9/2 lmtcb//kn

## 2013-03-08 NOTE — Telephone Encounter (Signed)
Patient called to speak to Bonita Quin about a Runner, broadcasting/film/video.

## 2013-03-09 LAB — IPS PAP TEST WITH HPV

## 2013-03-10 ENCOUNTER — Ambulatory Visit (INDEPENDENT_AMBULATORY_CARE_PROVIDER_SITE_OTHER): Payer: 59

## 2013-03-10 ENCOUNTER — Ambulatory Visit (INDEPENDENT_AMBULATORY_CARE_PROVIDER_SITE_OTHER): Payer: 59 | Admitting: Obstetrics & Gynecology

## 2013-03-10 VITALS — BP 128/82 | Ht 64.5 in | Wt 153.8 lb

## 2013-03-10 DIAGNOSIS — D219 Benign neoplasm of connective and other soft tissue, unspecified: Secondary | ICD-10-CM

## 2013-03-10 DIAGNOSIS — D259 Leiomyoma of uterus, unspecified: Secondary | ICD-10-CM

## 2013-03-10 DIAGNOSIS — D260 Other benign neoplasm of cervix uteri: Secondary | ICD-10-CM

## 2013-03-10 NOTE — Progress Notes (Signed)
47 y.o.Singlefemale here for a pelvic ultrasound after having physical exam finding of prolapsed cervical fibroid.  Patient reports cramping she was having is now all gone.  Really feeling well.  Nervous about U/S today.  Cycling normally.  Flow not heavy.    Patient's last menstrual period was 03/04/2013.  Sexually active:  no  Contraception: abstinence  FINDINGS: UTERUS: 9.0 x 6.7 x 5.8cm with at least 3 fibroids (2.8cm--cervical, 2.2cm and 3.3cm) EMS: 6.28mm ADNEXA:   Left ovary 2.7 x 1.6 x 1.6cm   Right ovary 2.6 x 1.5 x 1.2cm CUL DE SAC: no free fluid  Images and results discussed with patient.  Assessment:  Uterine fibroids including a prolapsed cervical fibroid Plan: Declines surgical removal of cervical fibroid as she now feels she is really not having any problems.  D/W pt reasons for removal--pain/cramping/change in bleeding.  Will plan to follow-up for AEX next year and pt knows to call with any new problems/issues.  All questions answered.    ~15 minutes spent with patient >50% of time was in face to face discussion of above.

## 2013-03-10 NOTE — Patient Instructions (Addendum)
Please call if you have heavier bleeding or worsening cramping

## 2013-03-18 ENCOUNTER — Encounter: Payer: Self-pay | Admitting: Obstetrics & Gynecology

## 2013-03-18 DIAGNOSIS — D219 Benign neoplasm of connective and other soft tissue, unspecified: Secondary | ICD-10-CM | POA: Insufficient documentation

## 2013-03-21 ENCOUNTER — Other Ambulatory Visit: Payer: Self-pay

## 2013-03-21 DIAGNOSIS — Z1231 Encounter for screening mammogram for malignant neoplasm of breast: Secondary | ICD-10-CM

## 2013-04-22 ENCOUNTER — Telehealth: Payer: Self-pay | Admitting: Obstetrics & Gynecology

## 2013-04-22 NOTE — Telephone Encounter (Signed)
Patient called at 5:34 yesterday and left a message about a billing question.

## 2013-04-22 NOTE — Telephone Encounter (Signed)
Patient is asking for Shanda Bumps to give her a call.

## 2013-05-17 ENCOUNTER — Ambulatory Visit
Admission: RE | Admit: 2013-05-17 | Discharge: 2013-05-17 | Disposition: A | Discharge status: Home / Self Care | Payer: 59 | Source: Ambulatory Visit

## 2013-05-17 DIAGNOSIS — Z1231 Encounter for screening mammogram for malignant neoplasm of breast: Secondary | ICD-10-CM

## 2013-06-14 ENCOUNTER — Encounter: Payer: Self-pay | Admitting: Obstetrics & Gynecology

## 2013-06-14 ENCOUNTER — Ambulatory Visit (INDEPENDENT_AMBULATORY_CARE_PROVIDER_SITE_OTHER): Payer: 59 | Admitting: Obstetrics & Gynecology

## 2013-06-14 VITALS — BP 150/88 | HR 68 | Resp 16 | Ht 64.5 in | Wt 152.8 lb

## 2013-06-14 DIAGNOSIS — D259 Leiomyoma of uterus, unspecified: Secondary | ICD-10-CM

## 2013-06-14 DIAGNOSIS — D219 Benign neoplasm of connective and other soft tissue, unspecified: Secondary | ICD-10-CM

## 2013-06-14 NOTE — Progress Notes (Signed)
Subjective:     Patient ID: Erin [name], female   DOB: 08-19-1965, 47 y.o.   MRN: [id number]  HPI 47 yo G0 here for 3 month recheck with h/o prolapsing cervical fibroid.  Pt reports cycles are normal lasting 4-5 days.  Flow is unchanged and can be heavy but this is "normal" for patient.  She does have some fishy smelling discharge from time to time.  Not sexually active.  Pt underwent ultrasound 03/10/13 showing 9 x 6.7 x 5.8cm uterus with 2.8, 2.2, and 3.3cm fibroids.  The 2.8cm fibroid was cervical and partially prolapsing through cervix.  Pt not interested in treatment them.  Denies cramping.  Reports sexual activity once over a month ago with pain with insertion.  They stopped and she wonders if the fibroid is related.  Never had something like this in the past.  Review of Systems  All other systems reviewed and are negative.       Objective:   Physical Exam  Constitutional: She is oriented to person, place, and time. She appears well-developed and well-nourished.  Genitourinary: There is no rash or tenderness on the right labia. There is no rash or tenderness on the left labia. Uterus is enlarged (about 10 weeks size). Uterus is not fixed and not tender. Cervix exhibits discharge (with 3cm fully prolapsed cervical fibroid noted.). Cervix exhibits no motion tenderness. No erythema, tenderness or bleeding around the vagina. No foreign body around the vagina. No signs of injury around the vagina. Vaginal discharge found.  Musculoskeletal: Normal range of motion.  Lymphadenopathy:       Right: No inguinal adenopathy present.       Left: No inguinal adenopathy present.  Neurological: She is alert and oriented to person, place, and time.  Skin: Skin is warm and dry.  Psychiatric: She has a normal mood and affect.       Assessment:     Cervical fibroid, prolapsed     Plan:     D/W pt finding.  She has decided she wants to proceed with excision of cervical fibroid.  Risks of bleeding,  infection, need for additional procedures, recurrence, abnormal pathology, DVT/PE, reaction to anesthesia all discussed.  I agree with plan to excise this as it has fully prolapsed through the cervix.  Will see if can get scheduled before the end of the year, per pt request.  ~15 minutes spent with patient >50% of time was in face to face discussion of above.

## 2013-06-27 ENCOUNTER — Encounter (HOSPITAL_COMMUNITY): Payer: 59 | Admitting: Anesthesiology

## 2013-06-27 ENCOUNTER — Ambulatory Visit (HOSPITAL_COMMUNITY): Payer: 59 | Admitting: Anesthesiology

## 2013-06-27 ENCOUNTER — Encounter (HOSPITAL_COMMUNITY): Payer: Self-pay | Admitting: Anesthesiology

## 2013-06-27 ENCOUNTER — Encounter (HOSPITAL_COMMUNITY)
Admission: RE | Disposition: A | Discharge status: Home / Self Care | Payer: Self-pay | Source: Ambulatory Visit | Attending: Obstetrics & Gynecology

## 2013-06-27 ENCOUNTER — Ambulatory Visit (HOSPITAL_COMMUNITY)
Admission: RE | Admit: 2013-06-27 | Discharge: 2013-06-27 | Disposition: A | Discharge status: Home / Self Care | Payer: 59 | Source: Ambulatory Visit | Attending: Obstetrics & Gynecology | Admitting: Obstetrics & Gynecology

## 2013-06-27 DIAGNOSIS — D259 Leiomyoma of uterus, unspecified: Secondary | ICD-10-CM | POA: Insufficient documentation

## 2013-06-27 DIAGNOSIS — D281 Benign neoplasm of vagina: Secondary | ICD-10-CM

## 2013-06-27 HISTORY — PX: MYOMECTOMY: SHX85

## 2013-06-27 LAB — CBC
MCHC: 37.8 g/dL — ABNORMAL HIGH (ref 30.0–36.0)
MCV: 81.2 fL (ref 78.0–100.0)
Platelets: 302 10*3/uL (ref 150–400)
RBC: 5.01 MIL/uL (ref 3.87–5.11)
RDW: 13.4 % (ref 11.5–15.5)
WBC: 10.2 10*3/uL (ref 4.0–10.5)

## 2013-06-27 LAB — PREGNANCY, URINE: Preg Test, Ur: NEGATIVE

## 2013-06-27 SURGERY — MYOMECTOMY, UTERUS, VAGINAL APPROACH
Anesthesia: General | Site: Vagina

## 2013-06-27 MED ORDER — LIDOCAINE HCL (CARDIAC) 20 MG/ML IV SOLN
INTRAVENOUS | Status: DC | PRN
  Administered 2013-06-27: 30 mg via INTRAVENOUS
  Administered 2013-06-27: 70 mg via INTRAVENOUS

## 2013-06-27 MED ORDER — LIDOCAINE-EPINEPHRINE (PF) 1 %-1:200000 IJ SOLN
INTRAMUSCULAR | Status: AC
  Filled 2013-06-27: qty 10

## 2013-06-27 MED ORDER — KETOROLAC TROMETHAMINE 30 MG/ML IJ SOLN
INTRAMUSCULAR | Status: AC
  Filled 2013-06-27: qty 1

## 2013-06-27 MED ORDER — FENTANYL CITRATE 0.05 MG/ML IJ SOLN
INTRAMUSCULAR | Status: DC | PRN
  Administered 2013-06-27: 50 ug via INTRAVENOUS

## 2013-06-27 MED ORDER — MEPERIDINE HCL 25 MG/ML IJ SOLN: 6.2500 mg | INTRAMUSCULAR | Status: DC | PRN

## 2013-06-27 MED ORDER — MIDAZOLAM HCL 2 MG/2ML IJ SOLN
INTRAMUSCULAR | Status: AC
  Filled 2013-06-27: qty 2

## 2013-06-27 MED ORDER — METOCLOPRAMIDE HCL 5 MG/ML IJ SOLN: 10.0000 mg | Freq: Once | INTRAMUSCULAR | Status: DC | PRN

## 2013-06-27 MED ORDER — DEXTROSE 5 % IV SOLN
2.0000 g | INTRAVENOUS | Status: AC
  Administered 2013-06-27: 2 g via INTRAVENOUS
  Filled 2013-06-27: qty 2

## 2013-06-27 MED ORDER — SODIUM CHLORIDE 0.9 % IJ SOLN
INTRAMUSCULAR | Status: AC
  Filled 2013-06-27: qty 100

## 2013-06-27 MED ORDER — VASOPRESSIN 20 UNIT/ML IJ SOLN
INTRAMUSCULAR | Status: AC
  Filled 2013-06-27: qty 1

## 2013-06-27 MED ORDER — PROPOFOL 10 MG/ML IV BOLUS
INTRAVENOUS | Status: DC | PRN
  Administered 2013-06-27: 160 mg via INTRAVENOUS

## 2013-06-27 MED ORDER — LIDOCAINE HCL (CARDIAC) 20 MG/ML IV SOLN
INTRAVENOUS | Status: AC
  Filled 2013-06-27: qty 5

## 2013-06-27 MED ORDER — LACTATED RINGERS IV SOLN
INTRAVENOUS | Status: DC
  Administered 2013-06-27: 09:00:00 via INTRAVENOUS

## 2013-06-27 MED ORDER — ONDANSETRON HCL 4 MG/2ML IJ SOLN
INTRAMUSCULAR | Status: DC | PRN
  Administered 2013-06-27: 4 mg via INTRAVENOUS

## 2013-06-27 MED ORDER — KETOROLAC TROMETHAMINE 30 MG/ML IJ SOLN: 15.0000 mg | Freq: Once | INTRAMUSCULAR | Status: DC | PRN

## 2013-06-27 MED ORDER — FENTANYL CITRATE 0.05 MG/ML IJ SOLN: 25.0000 ug | INTRAMUSCULAR | Status: DC | PRN

## 2013-06-27 MED ORDER — PROPOFOL 10 MG/ML IV EMUL
INTRAVENOUS | Status: AC
  Filled 2013-06-27: qty 20

## 2013-06-27 MED ORDER — MIDAZOLAM HCL 2 MG/2ML IJ SOLN
INTRAMUSCULAR | Status: DC | PRN
  Administered 2013-06-27: 1 mg via INTRAVENOUS

## 2013-06-27 MED ORDER — FENTANYL CITRATE 0.05 MG/ML IJ SOLN
INTRAMUSCULAR | Status: AC
  Filled 2013-06-27: qty 2

## 2013-06-27 MED ORDER — ONDANSETRON HCL 4 MG/2ML IJ SOLN
INTRAMUSCULAR | Status: AC
  Filled 2013-06-27: qty 2

## 2013-06-27 MED ORDER — KETOROLAC TROMETHAMINE 30 MG/ML IJ SOLN
INTRAMUSCULAR | Status: DC | PRN
  Administered 2013-06-27: 30 mg via INTRAVENOUS

## 2013-06-27 MED ORDER — VASOPRESSIN 20 UNIT/ML IJ SOLN
INTRAMUSCULAR | Status: DC | PRN
  Administered 2013-06-27: 10 [IU]

## 2013-06-27 MED ORDER — HYDROCODONE-ACETAMINOPHEN 5-300 MG PO TABS: 1.0000 | ORAL_TABLET | Freq: Four times a day (QID) | ORAL | Status: DC

## 2013-06-27 SURGICAL SUPPLY — 21 items
CANISTER SUCT 3000ML (MISCELLANEOUS) ×2 IMPLANT
CLOTH BEACON ORANGE TIMEOUT ST (SAFETY) ×2 IMPLANT
CONT PATH 16OZ SNAP LID 3702 (MISCELLANEOUS) IMPLANT
CONTAINER PREFILL 10% NBF 60ML (FORM) ×2 IMPLANT
DECANTER SPIKE VIAL GLASS SM (MISCELLANEOUS) IMPLANT
GLOVE BIO SURGEON STRL SZ 6.5 (GLOVE) ×2 IMPLANT
GLOVE BIOGEL PI IND STRL 7.0 (GLOVE) ×2 IMPLANT
GLOVE BIOGEL PI INDICATOR 7.0 (GLOVE) ×2
GOWN PREVENTION PLUS LG XLONG (DISPOSABLE) ×8 IMPLANT
NEEDLE HYPO 22GX1.5 SAFETY (NEEDLE) IMPLANT
NEEDLE SPNL 22GX3.5 QUINCKE BK (NEEDLE) IMPLANT
NS IRRIG 1000ML POUR BTL (IV SOLUTION) ×2 IMPLANT
PACK VAGINAL WOMENS (CUSTOM PROCEDURE TRAY) ×2 IMPLANT
SUT VIC AB 0 CT1 18XCR BRD8 (SUTURE) IMPLANT
SUT VIC AB 0 CT1 27 (SUTURE) ×1
SUT VIC AB 0 CT1 27XBRD ANBCTR (SUTURE) ×1 IMPLANT
SUT VIC AB 0 CT1 8-18 (SUTURE)
SUT VICRYL 0 TIES 12 18 (SUTURE) IMPLANT
TOWEL OR 17X24 6PK STRL BLUE (TOWEL DISPOSABLE) ×4 IMPLANT
TRAY FOLEY CATH 14FR (SET/KITS/TRAYS/PACK) ×2 IMPLANT
WATER STERILE IRR 1000ML POUR (IV SOLUTION) ×2 IMPLANT

## 2013-06-27 NOTE — Anesthesia Postprocedure Evaluation (Signed)
  Anesthesia Post-op Note  Anesthesia Post Note  Patient: Erin [name]  Procedure(s) Performed: Procedure(s) (LRB): VAGINAL MYOMECTOMY (N/A)  Anesthesia type: General  Patient location: PACU  Post pain: Pain level controlled  Post assessment: Post-op Vital signs reviewed  Last Vitals:  Filed Vitals:   06/27/13 1107  BP:   Pulse:   Temp: 36.4 C  Resp: 16    Post vital signs: Reviewed  Level of consciousness: sedated  Complications: No apparent anesthesia complications

## 2013-06-27 NOTE — Anesthesia Preprocedure Evaluation (Addendum)
Anesthesia Evaluation  Patient identified by MRN, date of birth, ID band Patient awake    Reviewed: Allergy & Precautions, H&P , NPO status , Patient's Chart, lab work & pertinent test results, reviewed documented beta blocker date and time   History of Anesthesia Complications Negative for: history of anesthetic complications  Airway Mallampati: III TM Distance: >3 FB Neck ROM: full    Dental  (+) Teeth Intact   Pulmonary neg pulmonary ROS,  breath sounds clear to auscultation  Pulmonary exam normal       Cardiovascular Exercise Tolerance: Good negative cardio ROS  Rhythm:regular Rate:Normal     Neuro/Psych  Headaches (menstrual), PSYCHIATRIC DISORDERS (anxiety/depression)    GI/Hepatic negative GI ROS, Neg liver ROS,   Endo/Other  negative endocrine ROS  Renal/GU negative Renal ROS  Female GU complaint     Musculoskeletal   Abdominal   Peds  Hematology  (+) Sickle cell trait ,   Anesthesia Other Findings   Reproductive/Obstetrics negative OB ROS                          Anesthesia Physical Anesthesia Plan  ASA: II  Anesthesia Plan: General LMA   Post-op Pain Management:    Induction:   Airway Management Planned:   Additional Equipment:   Intra-op Plan:   Post-operative Plan:   Informed Consent: I have reviewed the patients History and Physical, chart, labs and discussed the procedure including the risks, benefits and alternatives for the proposed anesthesia with the patient or authorized representative who has indicated his/her understanding and acceptance.   Dental Advisory Given  Plan Discussed with: CRNA and Surgeon  Anesthesia Plan Comments:         Anesthesia Quick Evaluation

## 2013-06-27 NOTE — Transfer of Care (Signed)
Immediate Anesthesia Transfer of Care Note  Patient: Erin [name]  Procedure(s) Performed: Procedure(s): VAGINAL MYOMECTOMY (N/A)  Patient Location: PACU  Anesthesia Type:General  Level of Consciousness: awake, alert  and patient cooperative  Airway & Oxygen Therapy: Patient Spontanous Breathing and Patient connected to nasal cannula oxygen  Post-op Assessment: Report given to PACU RN and Post -op Vital signs reviewed and stable  Post vital signs: Reviewed and stable  Complications: No apparent anesthesia complications

## 2013-06-27 NOTE — Op Note (Signed)
06/27/2013  10:35 AM  PATIENT:  Erin [name]  48 y.o. female  PRE-OPERATIVE DIAGNOSIS:  PROLAPSE CERVICAL FIBROID  POST-OPERATIVE DIAGNOSIS:  Vaginal Fibroid   PROCEDURE:  Procedure(s): VAGINAL MYOMECTOMY  SURGEON:  Lucee Brissett SUZANNE  ASSISTANTS: OR staff   ANESTHESIA:   LMA  ESTIMATED BLOOD LOSS: 10cc  BLOOD ADMINISTERED:none   FLUIDS: 500ccLR  UOP: 75cc clear urine drained at beginning of procedure with I&O catheterization  SPECIMEN:  Cervical/vaginal fibroid and vaginal mucosa  DISPOSITION OF SPECIMEN:  PATHOLOGY  FINDINGS: Walnut sized fibroid coming off the left side of the cervix.  This was prolapsed through the cervix but instead off the side of the cervix and vagina.  DESCRIPTION OF OPERATION: Patient was taken to the operating room. She is placed in the supine position. Anesthesia was administered by the anesthesia staff without difficulty. Dr. Rodman Pickle oversaw case. Once adequate anesthesia was confirmed, the patient's legs are placed in the [place] stirrups. SCDs were on her lower extremities bilaterally and functioning properly. Legs were lifted to the high lithotomy position. Timeout was performed.  Patient was prepped in a normal sterile sterile fashion with Betadine prep. I&O catheterization was performed without difficulty.  She was then draped in a normal sterile fashion. A heavy weighted speculum is placed in the vagina. The cervix was visualized and the fibroid was seen. Intraoperatively it was realized the fibroid was not prolapsing through the cervix but on the left side of the cervix and vagina. The fibroid was grasped with a single-tooth tenaculum and injected with Pitressin (20 units in the 100 cc of normal saline). An incision was made over the mucosa, on top, of the fibroid and the capsule of the fibroid was incised, allowing the fibroid to essentially be peeled out without difficulty. The vaginal mucosa was trimmed and the vaginal vaginal/cervical  tissue was closed with a running interlocking stitch suture #0 Vicryl. There small amount of bleeding at the superior end of this incision was made hemostatic with cautery. No bleeding was noted at end of the procedure. The vagina was irrigated. Excellent hemostasis was present.  All instruments were removed.  At this point the procedure was ended. Sponge, lap, needle, initially counts were correct x2. Betadine prep was cleansed of the patient's skin. Her legs are positioned back in the supine position. She was awake from anesthesia and taken to recovery in stable condition.  COUNTS:  YES  PLAN OF CARE: Transfer to PACU

## 2013-06-27 NOTE — H&P (Signed)
Erin [name] is an 47 y.o. female G0 SAAF with prolapsed cervical fibroid here for resection due to continued vaginal discharge and odor.  Risks and benefits discussed in office with pt.  Major risk is bleeding.  She has been counseled about this and we discussed this again today.  Pt has declined more aggressive surgery.  I did not feel I could do this in the office.  Pertinent Gynecological History: Menses: flow is moderate Bleeding: normal Contraception: none DES exposure: denies Blood transfusions: none Sexually transmitted diseases: no past history Previous GYN Procedures: none  Last mammogram: normal Date: 05/17/13  Last pap: normal Date: 03/04/13 OB History: G0, P0   Menstrual History: Patient's last menstrual period was 05/26/2013.    Past Medical History  Diagnosis Date  . Sickle cell trait        . Anxiety   . Headache     menstrual  . Gonorrhea 1988    Past Surgical History  Procedure Laterality Date  . Wisdom tooth extraction    . Toe surgery  7/09    broken toe  . Ablation saphenous vein w/ rfa      Right leg  . Breast biopsy  06/10/2011    Procedure: BREAST BIOPSY WITH NEEDLE LOCALIZATION;  Surgeon: Wilmon Arms. Corliss Skains, MD;  Location: [facility];  Service: General;  Laterality: Right;  Right needle localized lumpectomy    Family History  Problem Relation Age of Onset  . Hypertension Mother   . Heart disease Maternal Grandmother   . Anxiety disorder Mother   . Tuberculosis Father     ?/not sure  . Hypertension Sister     Social History:  reports that she has never smoked. She has never used smokeless tobacco. She reports that she drinks alcohol. She reports that she does not use illicit drugs.  Allergies:  Allergies  Allergen Reactions  . Prozac [Fluoxetine]     Suicidal thoughts and depression  . Sulfamethoxazole     REACTION: hives    Prescriptions prior to admission  Medication Sig Dispense Refill  . buPROPion (WELLBUTRIN XL) 300 MG 24 hr tablet  Take 300 mg by mouth daily.      . busPIRone (BUSPAR) 15 MG tablet Take 15 mg by mouth 2 (two) times daily.       . drospirenone-ethinyl estradiol (GIANVI) 3-0.02 MG tablet Take 1 tablet by mouth daily.  84 tablet  3  . fish oil-omega-3 fatty acids 1000 MG capsule Take 1 g by mouth daily.        Marland Kitchen glucosamine-chondroitin 500-400 MG tablet Take 1 tablet by mouth daily.        . Multiple Vitamin (MULTIVITAMIN) tablet Take 1 tablet by mouth daily.        Marland Kitchen OVER THE COUNTER MEDICATION Pm phytogen complex (pueraria mirifica)      . aspirin 81 MG chewable tablet Chew 81 mg by mouth daily.          Review of Systems  All other systems reviewed and are negative.    Height 5\' 4"  (1.626 m), weight 152 lb (68.947 kg), last menstrual period 05/26/2013. Physical Exam  Constitutional: She is oriented to person, place, and time. She appears well-developed and well-nourished.  Cardiovascular: Normal rate and regular rhythm.   Respiratory: Effort normal and breath sounds normal.  Musculoskeletal: Normal range of motion.  Neurological: She is alert and oriented to person, place, and time.  Skin: Skin is warm and dry.  Psychiatric: She has  a normal mood and affect.    Results for orders placed during the hospital encounter of 06/27/13 (from the past 24 hour(s))  CBC     Status: Abnormal   Collection Time    06/27/13  8:47 AM      Result Value Range   WBC 10.2  4.0 - 10.5 K/uL   RBC 5.01  3.87 - 5.11 MIL/uL   Hemoglobin 15.4 (*) 12.0 - 15.0 g/dL   HCT 19.1  47.8 - 29.5 %   MCV 81.2  78.0 - 100.0 fL   MCH 30.7  26.0 - 34.0 pg   MCHC 37.8 (*) 30.0 - 36.0 g/dL   RDW 62.1  30.8 - 65.7 %   Platelets 302  150 - 400 K/uL  PREGNANCY, URINE     Status: None   Collection Time    06/27/13  8:59 AM      Result Value Range   Preg Test, Ur NEGATIVE  NEGATIVE    No results found.  Assessment/Plan: 56 G0 WAAF here for myomectomy, vaginal approach, due to prolapsed cervical fibroids.  Risks, benefits  reviewed.  Pt here and ready to proceed.  Valentina Shaggy [facility] 06/27/2013, 9:43 AM

## 2013-06-28 ENCOUNTER — Encounter (HOSPITAL_COMMUNITY): Payer: Self-pay | Admitting: Obstetrics & Gynecology

## 2013-07-05 ENCOUNTER — Telehealth: Payer: Self-pay | Admitting: Emergency Medicine

## 2013-07-05 NOTE — Telephone Encounter (Signed)
Spoke with patient and message from Dr. Hyacinth Meeker given. Verbalized understanding and will see Dr. Hyacinth Meeker at follow up.

## 2013-07-05 NOTE — Telephone Encounter (Signed)
Message copied by Joeseph Amor on Tue Jul 05, 2013 11:28 AM ------      Message from: Jerene Bears      Created: Mon Jul 04, 2013  1:29 PM       Please inform pathology showed fibroid with inflammation.  I will go over this personally at follow up visit. ------

## 2013-07-05 NOTE — Telephone Encounter (Signed)
Message left to return call to Manoj Enriquez at [phone].    

## 2013-07-12 ENCOUNTER — Ambulatory Visit: Payer: 59 | Admitting: Obstetrics & Gynecology

## 2013-07-26 ENCOUNTER — Telehealth: Payer: Self-pay | Admitting: *Deleted

## 2013-07-26 NOTE — Telephone Encounter (Signed)
Patient states doing very well post op. Has appt 08-04-13.  Had some vaginal discharge, resolved now. Did not end up needing FMLA forms so requests they be shredded and we can refund fee paid.  Routing to provider for final review. Patient agreeable to disposition. Will close encounter   Gabriel Cirri, please process refund of FMLA forms fee.

## 2013-07-28 ENCOUNTER — Ambulatory Visit: Payer: 59 | Admitting: Obstetrics & Gynecology

## 2013-08-04 ENCOUNTER — Ambulatory Visit (INDEPENDENT_AMBULATORY_CARE_PROVIDER_SITE_OTHER): Payer: 59 | Admitting: Obstetrics & Gynecology

## 2013-08-04 VITALS — BP 136/84 | HR 68 | Resp 16 | Ht 64.5 in | Wt 153.2 lb

## 2013-08-04 DIAGNOSIS — D260 Other benign neoplasm of cervix uteri: Secondary | ICD-10-CM

## 2013-08-04 DIAGNOSIS — D259 Leiomyoma of uterus, unspecified: Secondary | ICD-10-CM

## 2013-08-04 NOTE — Progress Notes (Signed)
Post Operative Visit  Procedure:vaginal myomectomy Days Post-op: 39  Subjective: Doing well.  Discharge is MUCH better.  She can't believe how much better it is.  Pathology and procedure reviewed with patient.  All questions answered  Objective: BP 136/84  Pulse 68  Resp 16  Ht 5' 4.5" (1.638 m)  Wt 153 lb 3.2 oz (69.491 kg)  BMI 25.90 kg/m2  LMP 07/20/2013  EXAM General: alert and cooperative GI: soft, non-tender; bowel sounds normal; no masses,  no organomegaly Extremities: no edema GYN:  NAEFG, vaginal without discharge or odor, no VB, sutures still present on left inferior aspect of cervix.  Described this to patient.  All questions answered.  Assessment: s/p vaginal myomectomy.  Doing well.  Plan: Return for AEX, already scheduled.

## 2013-08-05 ENCOUNTER — Encounter: Payer: Self-pay | Admitting: Obstetrics & Gynecology

## 2014-02-03 ENCOUNTER — Telehealth: Payer: Self-pay | Admitting: Obstetrics & Gynecology

## 2014-02-03 DIAGNOSIS — D259 Leiomyoma of uterus, unspecified: Secondary | ICD-10-CM

## 2014-02-03 MED ORDER — DROSPIRENONE-ETHINYL ESTRADIOL 3-0.02 MG PO TABS: 1.0000 | ORAL_TABLET | Freq: Every day | ORAL | Status: DC

## 2014-02-03 NOTE — Telephone Encounter (Signed)
Patient calling for refill on: drospirenone-ethinyl estradiol (GIANVI) 3-0.02 MG tablet  Take 1 tablet by mouth daily., Starting 03/04/2013, Until Discontinued, Normal, Last Dose: Not Recorded  Patient now wants to use the pharmacy: [facility]  on [address]

## 2014-02-03 NOTE — Telephone Encounter (Signed)
Last refilled: 03/04/13 #84/3 refills  Last AEX: 03/04/13 with Dr. Bernita Buffy Scheduled: 03/17/14 with Dr. Luvenia Redden #84/0 refills sent to [facility], LM on patient's vm that rx has been sent.  Routed to provider for review, encounter closed.

## 2014-02-08 ENCOUNTER — Other Ambulatory Visit: Payer: Self-pay

## 2014-02-08 DIAGNOSIS — Z1231 Encounter for screening mammogram for malignant neoplasm of breast: Secondary | ICD-10-CM

## 2014-02-21 ENCOUNTER — Telehealth: Payer: Self-pay | Admitting: Obstetrics & Gynecology

## 2014-02-21 NOTE — Telephone Encounter (Signed)
Patient asking for recommendations for medical providers who prescribe "bioidentical hormones?"

## 2014-02-21 NOTE — Telephone Encounter (Signed)
Message left to return call to [place] at [phone].  Will advise these providers:   [place]: [place]: Sanda Klein, NP [place]: [phone]

## 2014-02-22 NOTE — Telephone Encounter (Signed)
Spoke with patient and advised of contact information below, advised these are providers in area that work with bio identicals but I could not say that these would be prescribed to her if she had office visit. Advised if patient has specific questions about hormones/use of hormones can schedule consult with Dr. Sabra Heck to discuss. Patient declines. Would like to discuss bio-identical hormones. She will call back if she decides to schedule consult with Dr. Sabra Heck.   Routing to provider for final review. Patient agreeable to disposition. Will close encounter

## 2014-03-16 ENCOUNTER — Other Ambulatory Visit: Payer: Self-pay | Admitting: Obstetrics & Gynecology

## 2014-03-16 ENCOUNTER — Telehealth: Payer: Self-pay | Admitting: Obstetrics & Gynecology

## 2014-03-16 NOTE — Telephone Encounter (Signed)
LMTCB to move patient's aex from 11:15 to 8:45 with Dr. Sabra Heck 03/17/14.

## 2014-03-17 ENCOUNTER — Ambulatory Visit: Payer: 59 | Admitting: Obstetrics & Gynecology

## 2014-03-17 ENCOUNTER — Ambulatory Visit (INDEPENDENT_AMBULATORY_CARE_PROVIDER_SITE_OTHER): Payer: 59 | Admitting: Obstetrics & Gynecology

## 2014-03-17 ENCOUNTER — Encounter: Payer: Self-pay | Admitting: Obstetrics & Gynecology

## 2014-03-17 VITALS — BP 128/80 | HR 68 | Resp 16 | Ht 64.5 in | Wt 165.0 lb

## 2014-03-17 DIAGNOSIS — Z0000 Encounter for general adult medical examination without abnormal findings: Secondary | ICD-10-CM

## 2014-03-17 DIAGNOSIS — D260 Other benign neoplasm of cervix uteri: Secondary | ICD-10-CM

## 2014-03-17 DIAGNOSIS — D259 Leiomyoma of uterus, unspecified: Secondary | ICD-10-CM

## 2014-03-17 DIAGNOSIS — N951 Menopausal and female climacteric states: Secondary | ICD-10-CM

## 2014-03-17 DIAGNOSIS — Z01419 Encounter for gynecological examination (general) (routine) without abnormal findings: Secondary | ICD-10-CM

## 2014-03-17 LAB — POCT URINALYSIS DIPSTICK
BILIRUBIN UA: NEGATIVE
Blood, UA: NEGATIVE
GLUCOSE UA: NEGATIVE
KETONES UA: NEGATIVE
LEUKOCYTES UA: NEGATIVE
Nitrite, UA: NEGATIVE
PROTEIN UA: NEGATIVE
Urobilinogen, UA: NEGATIVE
pH, UA: 5

## 2014-03-17 LAB — HEMOGLOBIN, FINGERSTICK: HEMOGLOBIN, FINGERSTICK: 14.7 g/dL (ref 12.0–16.0)

## 2014-03-17 LAB — LIPID PANEL
CHOLESTEROL: 134 mg/dL (ref 0–200)
HDL: 53 mg/dL (ref >39)
LDL Cholesterol: 62 mg/dL (ref 0–99)
Total CHOL/HDL Ratio: 2.5 Ratio
Triglycerides: 93 mg/dL (ref <150)
VLDL: 19 mg/dL (ref 0–40)

## 2014-03-17 MED ORDER — DROSPIRENONE-ETHINYL ESTRADIOL 3-0.02 MG PO TABS: 1.0000 | ORAL_TABLET | Freq: Every day | ORAL | Status: DC

## 2014-03-17 NOTE — Progress Notes (Signed)
48 y.o. G1P0 Erin [name] here for annual exam.  Cycles are regular on the Yaz.  Flow lasts 5-6 days.  Flow is not heavy.  Feels like she is gaining weight on this.  Decided to do testosterone pellet placements and she is feeling much better.  Reports her energy is better.  Has another appointment   Patient's last menstrual period was 03/02/2014.          Sexually active: No.  The current method of family planning is OCP (estrogen/progesterone).    Exercising: Yes.    3-4 days/week, high intensity interval training Smoker:  no  Health Maintenance: Pap:  03/04/13 WNL/negative HR HPV History of abnormal Pap:  no MMG:  05/17/13-normal Colonoscopy:  none BMD:   none TDaP:  6/10 Screening Labs: lipids today, Hb today: 14.7, Urine today: negative   reports that she has never smoked. She has never used smokeless tobacco. She reports that she drinks alcohol. She reports that she does not use illicit drugs.  Past Medical History  Diagnosis Date  . Sickle cell trait        . Anxiety   . Headache     menstrual  . Gonorrhea 1988    Past Surgical History  Procedure Laterality Date  . Wisdom tooth extraction    . Toe surgery  7/09    broken toe  . Ablation saphenous vein w/ rfa      Right leg  . Breast biopsy  06/10/2011    Procedure: BREAST BIOPSY WITH NEEDLE LOCALIZATION;  Surgeon: Imogene Burn. Georgette Dover, MD;  Location: [facility];  Service: General;  Laterality: Right;  Right needle localized lumpectomy  . Myomectomy N/A 06/27/2013    Procedure: VAGINAL MYOMECTOMY;  Surgeon: Lyman Speller, MD;  Location: [facility];  Service: Gynecology;  Laterality: N/A;    Current Outpatient Prescriptions  Medication Sig Dispense Refill  . drospirenone-ethinyl estradiol (GIANVI) 3-0.02 MG tablet Take 1 tablet by mouth daily.  84 tablet  0  . fish oil-omega-3 fatty acids 1000 MG capsule Take 1 g by mouth daily.        Marland Kitchen glucosamine-chondroitin 500-400 MG tablet Take 1 tablet by mouth daily.         . Multiple Vitamin (MULTIVITAMIN) tablet Take 1 tablet by mouth daily.        . NON FORMULARY Testosterone pellet 137.5 mg       No current facility-administered medications for this visit.    Family History  Problem Relation Age of Onset  . Hypertension Mother   . Heart disease Maternal Grandmother   . Anxiety disorder Mother   . Tuberculosis Father     ?/not sure  . Hypertension Sister     ROS:  Pertinent items are noted in HPI.  Otherwise, a comprehensive ROS was negative.  Exam:   BP 128/80  Pulse 68  Resp 16  Ht 5' 4.5" (1.638 m)  Wt 165 lb (74.844 kg)  BMI 27.90 kg/m2  LMP 03/02/2014  Weight change: +10#   Height: 5' 4.5" (163.8 cm)  Ht Readings from Last 3 Encounters:  03/17/14 5' 4.5" (1.638 m)  08/04/13 5' 4.5" (1.638 m)  06/22/13 5\' 4"  (1.626 m)    General appearance: alert, cooperative and appears stated age Head: Normocephalic, without obvious abnormality, atraumatic Neck: no adenopathy, supple, symmetrical, trachea midline and thyroid normal to inspection and palpation Lungs: clear to auscultation bilaterally Breasts: normal appearance, no masses or tenderness Heart: regular rate and rhythm Abdomen: soft, non-tender;  bowel sounds normal; no masses,  no organomegaly Extremities: extremities normal, atraumatic, no cyanosis or edema Skin: Skin color, texture, turgor normal. No rashes or lesions Lymph nodes: Cervical, supraclavicular, and axillary nodes normal. No abnormal inguinal nodes palpated Neurologic: Grossly normal   Pelvic: External genitalia:  no lesions              Urethra:  normal appearing urethra with no masses, tenderness or lesions              Bartholins and Skenes: normal                 Vagina: normal appearing vagina with normal color and discharge, no lesions              Cervix: no lesions              Pap taken: No. Bimanual Exam:  Uterus:  normal size, contour, position, consistency, mobility, non-tender              Adnexa:  normal adnexa and no mass, fullness, tenderness               Rectovaginal: Confirms               Anus:  normal sphincter tone, no lesions  A:  Well Woman with normal exam  H/O emotional changes last year that were hormonally related, improved now  Using testosterone pellet injections 10 # weight gain H/o prolapsing cervical fibroid, s/p resection 06/27/14  P: Mammogram yearly. D/W pt 3D due to dense breasts  FSH in Feb, 2016.  Order placed.  Until then, pt will stay on Yaz.  Rx to pharmacy.   pap smear with HR HPV today 2014.  No pap today. Lipids today. return annually or prn  An After Visit Summary was printed and given to the patient.

## 2014-04-03 ENCOUNTER — Telehealth: Payer: Self-pay | Admitting: Obstetrics & Gynecology

## 2014-04-03 NOTE — Telephone Encounter (Signed)
Pt states at her last visit she had talked with dr Sabra Heck about an appetite suppressant and she told her to let her know when she wanted it and she would call it in.

## 2014-04-03 NOTE — Telephone Encounter (Signed)
Left message to call Kodie Kishi at [phone]. 

## 2014-04-04 NOTE — Telephone Encounter (Signed)
I did not see anything in Dr. Ammie Ferrier visit note about phentermine.  Would it be ok to wait until she returns next week to review the request?

## 2014-04-04 NOTE — Telephone Encounter (Signed)
Spoke with patient. Patient states that she discussed taking phentermine with Dr.Miller. States she was told to call when she would like to start so Dr.Miller could send in order. Advised patient would send a message over to covering provider as Dr.Miller is out of the office this week and give patient a call back tomorrow. Patient is agreeable. Patient is using [facility] [address].  Routing to Dr.Silva as covering

## 2014-04-04 NOTE — Telephone Encounter (Signed)
Patient is returning a call to Kaitlyn. °

## 2014-04-06 NOTE — Telephone Encounter (Signed)
Spoke with patient. Advised will wait for Dr.Miller's return for review of request and will give her a call back next week. Patient is agreeable.

## 2014-04-06 NOTE — Telephone Encounter (Signed)
Pt is calling Kaitlyn back

## 2014-04-11 NOTE — Telephone Encounter (Signed)
I'm ok if she starts 15mg  daily.  I will need to do RX on Thurs am to be printed and faxed.  She will need follow up in one month.

## 2014-04-12 MED ORDER — PHENTERMINE HCL 15 MG PO CAPS: 15.0000 mg | ORAL_CAPSULE | ORAL | Status: DC

## 2014-04-12 NOTE — Telephone Encounter (Signed)
Order to your desk for review and signature when you return tomorrow before fax. Phentermine 15mg  #30 0RF.

## 2014-04-13 NOTE — Telephone Encounter (Signed)
Spoke with patient. Advised rx has been sent. One month follow up scheduled for 11/9 at 2:30pm with Dr.Miller. Patient agreeable to date and time.  Routing to provider for final review. Patient agreeable to disposition. Will close encounter

## 2014-04-13 NOTE — Telephone Encounter (Signed)
Left message to call [place] at [phone].  Need to advise order sent to [facility] per patient's request. Will need to schedule one month follow up with Dr.Miller.

## 2014-05-08 ENCOUNTER — Encounter: Payer: Self-pay | Admitting: Obstetrics & Gynecology

## 2014-05-15 ENCOUNTER — Ambulatory Visit (INDEPENDENT_AMBULATORY_CARE_PROVIDER_SITE_OTHER): Payer: 59 | Admitting: Obstetrics & Gynecology

## 2014-05-15 VITALS — BP 140/88 | HR 64 | Resp 16 | Ht 64.5 in | Wt 161.2 lb

## 2014-05-15 DIAGNOSIS — R634 Abnormal weight loss: Secondary | ICD-10-CM

## 2014-05-15 MED ORDER — PHENTERMINE HCL 15 MG PO CAPS: 15.0000 mg | ORAL_CAPSULE | ORAL | Status: DC

## 2014-05-15 NOTE — Progress Notes (Signed)
Patient ID: Erin [name], female   DOB: 12-Sep-1965, 48 y.o.   MRN: [id number]   48 G1P0 SAAF here for follow up after starting Phentermine.  Pt reports she is taking this in frequently.  She doesn't want to feel like she is dependent on a medication.  Pt working really hard on exercise and water intake.  Not doing a "diet" but watching her food choices.  She still thinks she may #15 capsules left.  She does want another prescription but she is only taking them one or two times a week.  She does feel like it's helping her mood.  She really thinks the exercise and water intake is really helping.  Working out at least 3 times a week for a minimum of 3 times a week.  Line dancing and walk/running.    Down 4 pounds.  Desires 10 pound weight loss.  Congratulated pt on her own efforts.    Assessment:  Desired weight loss Trial of phentermine which pt does not desire to continue for more than the next month or two.   Plan:  Phentermine 15mg  daily #30/0RF.  As pt taking only about twice weekly, she will continue until at desired weight and then stop.  ~15 minutes spent with patient >50% of time was in face to face discussion of above.

## 2014-05-18 ENCOUNTER — Ambulatory Visit
Admission: RE | Admit: 2014-05-18 | Discharge: 2014-05-18 | Disposition: A | Discharge status: Home / Self Care | Payer: 59 | Source: Ambulatory Visit

## 2014-05-18 DIAGNOSIS — Z1231 Encounter for screening mammogram for malignant neoplasm of breast: Secondary | ICD-10-CM

## 2014-05-28 ENCOUNTER — Encounter: Payer: Self-pay | Admitting: Obstetrics & Gynecology

## 2015-03-27 ENCOUNTER — Other Ambulatory Visit: Payer: Self-pay | Admitting: Obstetrics & Gynecology

## 2015-03-27 NOTE — Telephone Encounter (Signed)
Medication refill request: [place] Last AEX:  03/17/14 with SM Next AEX: 05/04/15 with SM Last MMG (if hormonal medication request): 05/18/14 3d dense category c, bi-rads c 1 neg Refill authorized: please advise

## 2015-04-13 ENCOUNTER — Other Ambulatory Visit: Payer: Self-pay

## 2015-04-13 DIAGNOSIS — Z1231 Encounter for screening mammogram for malignant neoplasm of breast: Secondary | ICD-10-CM

## 2015-05-04 ENCOUNTER — Ambulatory Visit (INDEPENDENT_AMBULATORY_CARE_PROVIDER_SITE_OTHER): Payer: 59 | Admitting: Obstetrics & Gynecology

## 2015-05-04 ENCOUNTER — Encounter: Payer: Self-pay | Admitting: Obstetrics & Gynecology

## 2015-05-04 VITALS — BP 120/82 | HR 60 | Resp 16 | Ht 64.0 in | Wt 160.0 lb

## 2015-05-04 DIAGNOSIS — Z124 Encounter for screening for malignant neoplasm of cervix: Secondary | ICD-10-CM | POA: Diagnosis not present

## 2015-05-04 DIAGNOSIS — Z0000 Encounter for general adult medical examination without abnormal findings: Secondary | ICD-10-CM | POA: Diagnosis not present

## 2015-05-04 DIAGNOSIS — Z01419 Encounter for gynecological examination (general) (routine) without abnormal findings: Secondary | ICD-10-CM | POA: Diagnosis not present

## 2015-05-04 LAB — COMPREHENSIVE METABOLIC PANEL
ALBUMIN: 3.9 g/dL (ref 3.6–5.1)
ALK PHOS: 49 U/L (ref 33–115)
ALT: 13 U/L (ref 6–29)
AST: 20 U/L (ref 10–35)
BILIRUBIN TOTAL: 0.4 mg/dL (ref 0.2–1.2)
BUN: 9 mg/dL (ref 7–25)
CHLORIDE: 104 mmol/L (ref 98–110)
CO2: 26 mmol/L (ref 20–31)
CREATININE: 0.88 mg/dL (ref 0.50–1.10)
Calcium: 8.7 mg/dL (ref 8.6–10.2)
Glucose, Bld: 91 mg/dL (ref 65–99)
Potassium: 4.2 mmol/L (ref 3.5–5.3)
SODIUM: 137 mmol/L (ref 135–146)
TOTAL PROTEIN: 7.1 g/dL (ref 6.1–8.1)

## 2015-05-04 LAB — POCT URINALYSIS DIPSTICK
Bilirubin, UA: NEGATIVE
Blood, UA: NEGATIVE
GLUCOSE UA: NEGATIVE
Ketones, UA: NEGATIVE
Leukocytes, UA: NEGATIVE
NITRITE UA: NEGATIVE
PH UA: 5
Protein, UA: NEGATIVE
UROBILINOGEN UA: NEGATIVE

## 2015-05-04 LAB — TSH: TSH: 3.225 u[IU]/mL (ref 0.350–4.500)

## 2015-05-04 LAB — VITAMIN D 25 HYDROXY (VIT D DEFICIENCY, FRACTURES): Vit D, 25-Hydroxy: 54 ng/mL (ref 30–100)

## 2015-05-04 LAB — LIPID PANEL
CHOLESTEROL: 145 mg/dL (ref 125–200)
HDL: 57 mg/dL (ref >=46)
LDL Cholesterol: 72 mg/dL (ref <130)
TRIGLYCERIDES: 82 mg/dL (ref <150)
Total CHOL/HDL Ratio: 2.5 Ratio (ref <=5.0)
VLDL: 16 mg/dL (ref <30)

## 2015-05-04 MED ORDER — DROSPIRENONE-ETHINYL ESTRADIOL 3-0.02 MG PO TABS: 1.0000 | ORAL_TABLET | Freq: Every day | ORAL | Status: DC

## 2015-05-04 NOTE — Progress Notes (Signed)
49 y.o. G1P0 SingleAfrican AmericanF here for annual exam.  Pt is doing well.  Normal cycles.  LMP 04/28/15.  Flow lasts four days.  Has already done flu shot.    PCP:  No recent visits.        Sexually active: No.  The current method of family planning is OCP (estrogen/progesterone).    Exercising: Yes.    cardio and weights Smoker:  no  Health Maintenance: Pap:  03/04/13 Neg. HR HPV:neg History of abnormal Pap:  no MMG:  05/18/14 BIRADS1:neg Colonoscopy:  None BMD:   None TDaP:  2010 Screening Labs: today, Hb today: 14.3, Urine today: negative   reports that she has never smoked. She has never used smokeless tobacco. She reports that she drinks alcohol. She reports that she does not use illicit drugs.  Past Medical History  Diagnosis Date  . Sickle cell trait        . Anxiety   . Headache     menstrual  . Gonorrhea 1988    Past Surgical History  Procedure Laterality Date  . Wisdom tooth extraction    . Toe surgery  7/09    broken toe  . Ablation saphenous vein w/ rfa      Right leg  . Breast biopsy  06/10/2011    Procedure: BREAST BIOPSY WITH NEEDLE LOCALIZATION;  Surgeon: Imogene Burn. Georgette Dover, MD;  Location: [place];  Service: General;  Laterality: Right;  Right needle localized lumpectomy  . Myomectomy N/A 06/27/2013    Procedure: VAGINAL MYOMECTOMY;  Surgeon: Lyman Speller, MD;  Location: [place] [facility];  Service: Gynecology;  Laterality: N/A;    Current Outpatient Prescriptions  Medication Sig Dispense Refill  . fish oil-omega-3 fatty acids 1000 MG capsule Take 1 g by mouth daily.      Marland Kitchen glucosamine-chondroitin 500-400 MG tablet Take 1 tablet by mouth daily.      . Multiple Vitamin (MULTIVITAMIN) tablet Take 1 tablet by mouth daily.      . NON FORMULARY Testosterone pellet 137.5 mg    . phentermine 15 MG capsule Take 1 capsule (15 mg total) by mouth every morning. 30 capsule 0  . VESTURA 3-0.02 MG tablet TAKE 1 TABLET BY MOUTH EVERY DAY 84 tablet 0   No current  facility-administered medications for this visit.    Family History  Problem Relation Age of Onset  . Hypertension Mother   . Heart disease Maternal Grandmother   . Anxiety disorder Mother   . Tuberculosis Father     ?/not sure  . Hypertension Sister     ROS:  Pertinent items are noted in HPI.  Otherwise, a comprehensive ROS was negative.  Exam:   General appearance: alert, cooperative and appears stated age Head: Normocephalic, without obvious abnormality, atraumatic Neck: no adenopathy, supple, symmetrical, trachea midline and thyroid normal to inspection and palpation Lungs: clear to auscultation bilaterally Breasts: normal appearance, no masses or tenderness Heart: regular rate and rhythm Abdomen: soft, non-tender; bowel sounds normal; no masses,  no organomegaly Extremities: extremities normal, atraumatic, no cyanosis or edema Skin: Skin color, texture, turgor normal. No rashes or lesions Lymph nodes: Cervical, supraclavicular, and axillary nodes normal. No abnormal inguinal nodes palpated Neurologic: Grossly normal   Pelvic: External genitalia:  no lesions              Urethra:  normal appearing urethra with no masses, tenderness or lesions              Bartholins and Skenes:  normal                 Vagina: normal appearing vagina with normal color and discharge, no lesions              Cervix: no lesions              Pap taken: Yes.   Bimanual Exam:  Uterus:  normal size, contour, position, consistency, mobility, non-tender              Adnexa: normal adnexa and no mass, fullness, tenderness               Rectovaginal: Confirms               Anus:  normal sphincter tone, no lesions  Chaperone was present for exam.  A:  Well Woman with normal exam  Using testosterone pellet injections at [facility] (office is on [address]) H/o prolapsing cervical fibroid, s/p resection 06/27/13  P: Mammogram yearly. D/W pt 3D due to dense breasts  Rx for Vestura (OCP)  #3 month supply/4RF. pap smear with HR HPV today 2014. Pap today. Lipids, CMP, TSH, Vit D today. return annually or prn

## 2015-05-07 LAB — IPS PAP TEST WITH REFLEX TO HPV

## 2015-05-07 LAB — HEMOGLOBIN, FINGERSTICK: HEMOGLOBIN, FINGERSTICK: 14.3 g/dL (ref 12.0–16.0)

## 2015-05-21 ENCOUNTER — Ambulatory Visit
Admission: RE | Admit: 2015-05-21 | Discharge: 2015-05-21 | Disposition: A | Discharge status: Home / Self Care | Payer: 59 | Source: Ambulatory Visit

## 2015-05-21 DIAGNOSIS — Z1231 Encounter for screening mammogram for malignant neoplasm of breast: Secondary | ICD-10-CM

## 2016-04-14 ENCOUNTER — Other Ambulatory Visit: Payer: Self-pay | Admitting: Obstetrics & Gynecology

## 2016-04-14 DIAGNOSIS — Z1231 Encounter for screening mammogram for malignant neoplasm of breast: Secondary | ICD-10-CM

## 2016-05-03 ENCOUNTER — Other Ambulatory Visit: Payer: Self-pay | Admitting: Obstetrics & Gynecology

## 2016-05-05 NOTE — Telephone Encounter (Signed)
Medication refill request: OCP Last AEX:  05-04-15  Next AEX: 08-28-16  Last MMG (if hormonal medication request): 05-21-15 WNL  Refill authorized: please advise

## 2016-05-21 ENCOUNTER — Ambulatory Visit
Admission: RE | Admit: 2016-05-21 | Discharge: 2016-05-21 | Disposition: A | Discharge status: Home / Self Care | Payer: BLUE CROSS/BLUE SHIELD | Source: Ambulatory Visit | Attending: Obstetrics & Gynecology | Admitting: Obstetrics & Gynecology

## 2016-05-21 DIAGNOSIS — Z1231 Encounter for screening mammogram for malignant neoplasm of breast: Secondary | ICD-10-CM

## 2016-08-26 NOTE — Progress Notes (Signed)
51 y.o. G1P0 Single African American F here for annual exam.  Reports she's doing well.     Getting testosterone pellet injections.  Last one was 12/17.  She reports she feels really good at this time.  Had gone from May to December for getting one.  Was trying to see how long she could go between the pellet injections.    Still cycling regularly.  Flow lasts 4-5 days.    Patient's last menstrual period was 08/17/2016.          Sexually active: No.  The current method of family planning is OCP (estrogen/progesterone).    Exercising: Yes.    occasional cardio and weights Smoker:  no  Health Maintenance: Pap:  05/04/15 negative, 03/04/13 negative, HR HPV negative  History of abnormal Pap:  no MMG:  05/21/16 BIRADS 1 negative  Colonoscopy:  Never BMD:   N/A TDaP:  12/08/08  Pneumonia vaccine(s):  never Zostavax:   never Hep C testing: not indicated  Screening Labs: none today   reports that she has never smoked. She has never used smokeless tobacco. She reports that she drinks alcohol. She reports that she does not use drugs.  Past Medical History:  Diagnosis Date  . Anxiety   . Gonorrhea 1988  . Headache    menstrual  . Sickle cell trait [facility])         Past Surgical History:  Procedure Laterality Date  . ABLATION SAPHENOUS VEIN W/ RFA     Right leg  . BREAST BIOPSY  06/10/2011   Procedure: BREAST BIOPSY WITH NEEDLE LOCALIZATION;  Surgeon: Imogene Burn. Georgette Dover, MD;  Location: [place];  Service: General;  Laterality: Right;  Right needle localized lumpectomy  . MYOMECTOMY N/A 06/27/2013   Procedure: VAGINAL MYOMECTOMY;  Surgeon: Lyman Speller, MD;  Location: [place] [facility];  Service: Gynecology;  Laterality: N/A;  . TOE SURGERY  7/09   broken toe  . WISDOM TOOTH EXTRACTION      Current Outpatient Prescriptions  Medication Sig Dispense Refill  . fish oil-omega-3 fatty acids 1000 MG capsule Take 1 g by mouth daily.      Marland Kitchen glucosamine-chondroitin 500-400 MG tablet Take 1 tablet by  mouth daily.      . Multiple Vitamin (MULTIVITAMIN) tablet Take 1 tablet by mouth daily.      . NON FORMULARY Testosterone pellet 137.5 mg    . NON FORMULARY Take one to two tablet po daily    . VESTURA 3-0.02 MG tablet TAKE 1 TABLET BY MOUTH DAILY 84 tablet 1   No current facility-administered medications for this visit.     Family History  Problem Relation Age of Onset  . Hypertension Mother   . Anxiety disorder Mother   . Tuberculosis Father     ?/not sure  . Heart disease Maternal Grandmother   . Hypertension Sister     ROS:  Pertinent items are noted in HPI.  Otherwise, a comprehensive ROS was negative.  Exam:   BP 128/80 (BP Location: Right Arm, Patient Position: Sitting, Cuff Size: Normal)   Pulse 76   Resp 16   Ht 5' 4.25" (1.632 m)   Wt 159 lb (72.1 kg)   LMP 08/17/2016   BMI 27.08 kg/m   Weight change: -2#  Height: 5' 4.25" (163.2 cm)  Ht Readings from Last 3 Encounters:  08/28/16 5' 4.25" (1.632 m)  05/04/15 5\' 4"  (1.626 m)  05/15/14 5' 4.5" (1.638 m)    General appearance: alert, cooperative  and appears stated age Head: Normocephalic, without obvious abnormality, atraumatic Neck: no adenopathy, supple, symmetrical, trachea midline and thyroid normal to inspection and palpation Lungs: clear to auscultation bilaterally Breasts: normal appearance, no masses or tenderness Heart: regular rate and rhythm Abdomen: soft, non-tender; bowel sounds normal; no masses,  no organomegaly Extremities: extremities normal, atraumatic, no cyanosis or edema Skin: Skin color, texture, turgor normal. No rashes or lesions Lymph nodes: Cervical, supraclavicular, and axillary nodes normal. No abnormal inguinal nodes palpated Neurologic: Grossly normal   Pelvic: External genitalia:  no lesions              Urethra:  normal appearing urethra with no masses, tenderness or lesions              Bartholins and Skenes: normal                 Vagina: normal appearing vagina with  normal color and discharge, no lesions              Cervix: no lesions              Pap taken: Yes.   Bimanual Exam:  Uterus:  normal size, contour, position, consistency, mobility, non-tender              Adnexa: normal adnexa and no mass, fullness, tenderness               Rectovaginal: Confirms               Anus:  normal sphincter tone, no lesions  Chaperone was present for exam.  A:   Well woman with normal exam Receiving testosterone injections H/O prolapsed cervical fibroid, s/p resection 06/27/14  P: Mammogram yearly.  Doing 3D due to [facility] breast density Pap and HR obtained today Plan lab work and [facility] next year.  Will try to time the [facility] with placbo week Continue yaz.  Rx to pharmacy. AEX 1 year or follow-up prn

## 2016-08-28 ENCOUNTER — Ambulatory Visit (INDEPENDENT_AMBULATORY_CARE_PROVIDER_SITE_OTHER): Payer: BLUE CROSS/BLUE SHIELD | Admitting: Obstetrics & Gynecology

## 2016-08-28 ENCOUNTER — Encounter: Payer: Self-pay | Admitting: Obstetrics & Gynecology

## 2016-08-28 VITALS — BP 128/80 | HR 76 | Resp 16 | Ht 64.25 in | Wt 159.0 lb

## 2016-08-28 DIAGNOSIS — Z01419 Encounter for gynecological examination (general) (routine) without abnormal findings: Secondary | ICD-10-CM | POA: Diagnosis not present

## 2016-08-28 DIAGNOSIS — Z124 Encounter for screening for malignant neoplasm of cervix: Secondary | ICD-10-CM | POA: Diagnosis not present

## 2016-08-28 MED ORDER — DROSPIRENONE-ETHINYL ESTRADIOL 3-0.02 MG PO TABS: 1.0000 | ORAL_TABLET | Freq: Every day | ORAL | 4 refills | Status: DC

## 2016-09-01 LAB — IPS PAP TEST WITH HPV

## 2016-09-02 ENCOUNTER — Telehealth: Payer: Self-pay | Admitting: Obstetrics & Gynecology

## 2016-09-02 ENCOUNTER — Other Ambulatory Visit: Payer: Self-pay | Admitting: *Deleted

## 2016-09-02 DIAGNOSIS — R87612 Low grade squamous intraepithelial lesion on cytologic smear of cervix (LGSIL): Secondary | ICD-10-CM

## 2016-09-02 DIAGNOSIS — R87810 Cervical high risk human papillomavirus (HPV) DNA test positive: Secondary | ICD-10-CM

## 2016-09-02 NOTE — Telephone Encounter (Signed)
Patient requesting to speak with you says it is about an appointment you made for her tomorrow.

## 2016-09-02 NOTE — Telephone Encounter (Signed)
See lab result dated 08/28/16, notes dated 09/02/16, will close encounter.

## 2016-09-03 ENCOUNTER — Encounter: Payer: Self-pay | Admitting: Obstetrics and Gynecology

## 2016-09-03 ENCOUNTER — Ambulatory Visit (INDEPENDENT_AMBULATORY_CARE_PROVIDER_SITE_OTHER): Payer: BLUE CROSS/BLUE SHIELD | Admitting: Obstetrics and Gynecology

## 2016-09-03 VITALS — BP 142/92 | HR 120 | Resp 16 | Wt 158.0 lb

## 2016-09-03 DIAGNOSIS — Z01812 Encounter for preprocedural laboratory examination: Secondary | ICD-10-CM | POA: Diagnosis not present

## 2016-09-03 DIAGNOSIS — R87612 Low grade squamous intraepithelial lesion on cytologic smear of cervix (LGSIL): Secondary | ICD-10-CM

## 2016-09-03 DIAGNOSIS — R87810 Cervical high risk human papillomavirus (HPV) DNA test positive: Secondary | ICD-10-CM | POA: Diagnosis not present

## 2016-09-03 LAB — POCT URINE PREGNANCY: Preg Test, Ur: NEGATIVE

## 2016-09-03 NOTE — Addendum Note (Signed)
Addended by: Dorothy Spark on: 09/03/2016 09:53 AM   Modules accepted: Orders

## 2016-09-03 NOTE — Progress Notes (Signed)
GYNECOLOGY  VISIT   HPI: 51 y.o.   Single  African American  female   G1P0 with Patient's last menstrual period was 08/17/2016.   here for colposcopy. Patient's recent pap returned with LSIL, +HPV. She denies a h/o prior abnormal pap smears.  She states she has only been sexually active x 2 in the last year with her ex.   GYNECOLOGIC HISTORY: Patient's last menstrual period was 08/17/2016. Contraception:OCP Menopausal hormone therapy: none         OB History    Gravida Para Term Preterm AB Living   1 0       0   SAB TAB Ectopic Multiple Live Births                     Patient Active Problem List   Diagnosis Date Noted  . Fibroid 03/18/2013  . Anxiety and depression 07/15/2011  . Rapid heart rate 07/15/2011  . 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 FIBROCYSTIC BREAST DISEASE 04/24/2009  . LIPOMA 12/08/2008    Past Medical History:  Diagnosis Date  . Anxiety   . Gonorrhea 1988  . Headache    menstrual  . Sickle cell trait [facility])         Past Surgical History:  Procedure Laterality Date  . ABLATION SAPHENOUS VEIN W/ RFA     Right leg  . BREAST BIOPSY  06/10/2011   Procedure: BREAST BIOPSY WITH NEEDLE LOCALIZATION;  Surgeon: Imogene Burn. Georgette Dover, MD;  Location: [place];  Service: General;  Laterality: Right;  Right needle localized lumpectomy  . MYOMECTOMY N/A 06/27/2013   Procedure: VAGINAL MYOMECTOMY;  Surgeon: Lyman Speller, MD;  Location: [place] [facility];  Service: Gynecology;  Laterality: N/A;  . TOE SURGERY  7/09   broken toe  . WISDOM TOOTH EXTRACTION      Current Outpatient Prescriptions  Medication Sig Dispense Refill  . drospirenone-ethinyl estradiol (VESTURA) 3-0.02 MG tablet Take 1 tablet by mouth daily. 84 tablet 4  . fish oil-omega-3 fatty acids 1000 MG capsule Take 1 g by mouth daily.      Marland Kitchen glucosamine-chondroitin 500-400 MG tablet Take 1 tablet by mouth daily.      . Multiple Vitamin (MULTIVITAMIN) tablet Take 1 tablet by  mouth daily.      . NON FORMULARY Testosterone pellet 137.5 mg    . NON FORMULARY Take one to two tablet po daily     No current facility-administered medications for this visit.      ALLERGIES: Prozac [fluoxetine] and Sulfamethoxazole  Family History  Problem Relation Age of Onset  . Hypertension Mother   . Anxiety disorder Mother   . Tuberculosis Father     ?/not sure  . Heart disease Maternal Grandmother   . Hypertension Sister     Social History   Social History  . Marital status: Single    Spouse name: N/A  . Number of children: N/A  . Years of education: N/A   Occupational History  . Not on file.   Social History Main Topics  . Smoking status: Never Smoker  . Smokeless tobacco: Never Used  . Alcohol use 0.0 oz/week     Comment: a few times a year  . Drug use: No  . Sexual activity: No   Other Topics Concern  . Not on file   Social History Narrative  . No narrative on file    Review of Systems  Constitutional:  Negative.   HENT: Negative.   Eyes: Negative.   Respiratory: Negative.   Cardiovascular: Negative.   Gastrointestinal: Negative.   Genitourinary: Negative.   Skin: Negative.   Neurological: Negative.   Endo/Heme/Allergies: Negative.   Psychiatric/Behavioral: Negative.     PHYSICAL EXAMINATION:    BP (!) 142/92 (BP Location: Right Arm, Patient Position: Sitting, Cuff Size: Normal)   Pulse (!) 120   Resp 16   Wt 158 lb (71.7 kg)   LMP 08/17/2016   BMI 26.91 kg/m     General appearance: alert, cooperative and appears stated age  Pelvic: External genitalia:  no lesions              Urethra:  normal appearing urethra with no masses, tenderness or lesions              Bartholins and Skenes: normal                 Vagina: normal appearing vagina with normal color and discharge, no lesions              Cervix: grossly normal  Colposcopy: Unsatisfactory, no transformation zone seen. No aceto-white changes. With application of lugos there  was some decreased uptake in the posterior cervix. Cervical biopsy at 7 o'clock. Negative lugols examination of the upper vagina. The cervix was stenotic, unable to insert the endocervical curette. A tenaculum was placed at 12 o'clock and the cervix was dilated to a #4 hagar dilator. The ECC was then obtained.  The biopsy site was treated with silver nitrate.   Chaperone was present for exam.  ASSESSMENT LSIL pap with hpv    PLAN Discussed the hpv infection, including the prevalence, that there is no test for men, and the high chance of clearing a low grade infection. Discussed that condoms can decrease the chance of transmission, but not eliminate it. Colposcopy with biopsy and ECC was performed Further plan based on results   An After Visit Summary was printed and given to the patient.  In addition to the colposcopy, 10 minutes face to face time of which over 50% was spent in counseling.   CC: Dr Sabra Heck

## 2016-09-03 NOTE — Patient Instructions (Signed)

## 2016-09-08 ENCOUNTER — Telehealth: Payer: Self-pay

## 2016-09-08 NOTE — Telephone Encounter (Signed)
-----   Message from Erin Dom, MD sent at 09/05/2016 11:57 AM EST ----- Please inform the patient that her biopsies returned c/w CIN I, recommendation is for a f/u pap and hpv in 1 year (with Dr Sabra Heck)

## 2016-09-08 NOTE — Telephone Encounter (Signed)
Spoke with patient. Advised of message and results as seen below. Patient is agreeable and verbalizes understanding. 08 recall placed.  Routing to provider for final review. Patient agreeable to disposition. Will close encounter.

## 2016-11-02 ENCOUNTER — Other Ambulatory Visit: Payer: Self-pay | Admitting: Obstetrics & Gynecology

## 2016-11-03 NOTE — Telephone Encounter (Signed)
Medication refill request: Erin [name]  Last AEX:  08/28/16 SM Next AEX: 09/11/17 SM  Last MMG (if hormonal medication request): 05/21/16 BIRADS1:neg  Refill authorized: 08/28/16 #84 tabs with 4 refills to [facility] [address]

## 2017-03-27 ENCOUNTER — Encounter: Payer: Self-pay | Admitting: Family Medicine

## 2017-04-16 ENCOUNTER — Other Ambulatory Visit: Payer: Self-pay | Admitting: Family Medicine

## 2017-04-16 DIAGNOSIS — Z1231 Encounter for screening mammogram for malignant neoplasm of breast: Secondary | ICD-10-CM

## 2017-05-25 ENCOUNTER — Ambulatory Visit
Admission: RE | Admit: 2017-05-25 | Discharge: 2017-05-25 | Disposition: A | Discharge status: Home / Self Care | Payer: BLUE CROSS/BLUE SHIELD | Source: Ambulatory Visit | Attending: Family Medicine | Admitting: Family Medicine

## 2017-05-25 DIAGNOSIS — Z1231 Encounter for screening mammogram for malignant neoplasm of breast: Secondary | ICD-10-CM

## 2017-08-20 ENCOUNTER — Telehealth: Payer: Self-pay | Admitting: Obstetrics & Gynecology

## 2017-08-20 NOTE — Telephone Encounter (Signed)
Patient is scheduled for annual exam 09/02/17 with Dr. Sabra Heck. Patient states she was told to stop her birth control 5 days before appointment but she wants to confirm this as Dr. Sabra Heck was going to check her hormone level. Please advise.

## 2017-08-20 NOTE — Telephone Encounter (Signed)
Spoke with patient advised  Dr.Miller does place to do lab work and [facility] at her aex. Advised will need to be in her placebo week or off her pills for five days prior to appointment. Advised will need a BUM for contraception. Patient verbalizes understanding.  Routing to [place] for final review.

## 2017-08-21 ENCOUNTER — Ambulatory Visit: Payer: Self-pay | Admitting: Obstetrics & Gynecology

## 2017-08-28 ENCOUNTER — Other Ambulatory Visit: Payer: Self-pay | Admitting: Obstetrics & Gynecology

## 2017-08-28 NOTE — Telephone Encounter (Signed)
Medication refill request: OCP  Last AEX:  08-28-16  Next AEX: 09-02-17  Last MMG (if hormonal medication request): 05-25-17 WNL  Refill authorized: please advise

## 2017-09-01 NOTE — Progress Notes (Signed)
52 y.o. G1P0 SingleAfrican AmericanF here for annual exam.  Desires to discuss HPV testing from last year again today.  Did have STD testing at the [facility].  Pt aware will have repeat Pap and HR HPV today.  Doing testosterone pellet injections at [facility].  Does this every 5 1/2 months.  Patient's last menstrual period was 08/16/2017.          Sexually active: No.  The current method of family planning is OCP (estrogen/progesterone).    Exercising: Yes.    cardio, weights Smoker:  no  Health Maintenance: Pap:  08/28/16 LSIL. HR HPV:+detected. Colpo: CIN1  05/04/15 neg  History of abnormal Pap:  yes MMG:  05/25/17 BIRADS1:neg  Colonoscopy:  Never BMD:   Never TDaP:  12/2008 Pneumonia vaccine(s):  n/a Shingrix:   No Hep C testing: n/a Screening Labs: here today    reports that  has never smoked. she has never used smokeless tobacco. She reports that she drinks alcohol. She reports that she does not use drugs.  Past Medical History:  Diagnosis Date  . Anxiety   . Gonorrhea 1988  . Headache    menstrual  . Sickle cell trait [facility])         Past Surgical History:  Procedure Laterality Date  . ABLATION SAPHENOUS VEIN W/ RFA     Right leg  . BREAST BIOPSY  06/10/2011   Procedure: BREAST BIOPSY WITH NEEDLE LOCALIZATION;  Surgeon: Imogene Burn. Georgette Dover, MD;  Location: [place];  Service: General;  Laterality: Right;  Right needle localized lumpectomy  . BREAST BIOPSY Right 05/2011  . BREAST EXCISIONAL BIOPSY Right 06/10/2011  . MYOMECTOMY N/A 06/27/2013   Procedure: VAGINAL MYOMECTOMY;  Surgeon: Lyman Speller, MD;  Location: [place] [facility];  Service: Gynecology;  Laterality: N/A;  . TOE SURGERY  7/09   broken toe  . WISDOM TOOTH EXTRACTION      Current Outpatient Medications  Medication Sig Dispense Refill  . clobetasol (TEMOVATE) 0.05 % external solution as needed.  0  . fish oil-omega-3 fatty acids 1000 MG capsule Take 1 g by mouth daily.      Marland Kitchen glucosamine-chondroitin  500-400 MG tablet Take 1 tablet by mouth daily.      . LORYNA 3-0.02 MG tablet TAKE 1 TABLET BY MOUTH DAILY 84 tablet 0  . Multiple Vitamin (MULTIVITAMIN) tablet Take 1 tablet by mouth daily.      . NON FORMULARY Testosterone pellet 137.5 mg    . NON FORMULARY Take one to two tablet po daily     No current facility-administered medications for this visit.     Family History  Problem Relation Age of Onset  . Hypertension Mother   . Anxiety disorder Mother   . Tuberculosis Father        ?/not sure  . Heart disease Maternal Grandmother   . Hypertension Sister     ROS:  Pertinent items are noted in HPI.  Otherwise, a comprehensive ROS was negative.  Exam:   BP 138/86 (BP Location: Right Arm, Patient Position: Sitting, Cuff Size: Normal)   Pulse 100   Resp 16   Ht 5' 4.5" (1.638 m)   Wt 168 lb (76.2 kg)   LMP 08/16/2017   BMI 28.39 kg/m   Weight change: +8#  Height: 5' 4.5" (163.8 cm)  Ht Readings from Last 3 Encounters:  09/02/17 5' 4.5" (1.638 m)  08/28/16 5' 4.25" (1.632 m)  05/04/15 5\' 4"  (1.626 m)  General appearance: alert, cooperative and appears stated age Head: Normocephalic, without obvious abnormality, atraumatic Neck: no adenopathy, supple, symmetrical, trachea midline and thyroid normal to inspection and palpation Lungs: clear to auscultation bilaterally Breasts: normal appearance, no masses or tenderness Heart: regular rate and rhythm Abdomen: soft, non-tender; bowel sounds normal; no masses,  no organomegaly Extremities: extremities normal, atraumatic, no cyanosis or edema Skin: Skin color, texture, turgor normal. No rashes or lesions Lymph nodes: Cervical, supraclavicular, and axillary nodes normal. No abnormal inguinal nodes palpated Neurologic: Grossly normal   Pelvic: External genitalia:  no lesions              Urethra:  normal appearing urethra with no masses, tenderness or lesions              Bartholins and Skenes: normal                  Vagina: normal appearing vagina with normal color and discharge, no lesions              Cervix: no lesions              Pap taken: Yes.   Bimanual Exam:  Uterus:  normal size, contour, position, consistency, mobility, non-tender              Adnexa: normal adnexa and no mass, fullness, tenderness               Rectovaginal: Confirms               Anus:  normal sphincter tone, no lesions  Chaperone was present for exam.  A:  Well Woman with normal exam  receiving testosterone injections about every 5-1/2 months of [place] History of prolapsed cervical fibroid status post resection 06/27/2014 Not sexually active History of LGSIL Pap 2/18 History of intraductal papilloma, left breast, status post excision 12/12  P:   Mammogram guidelines reviewed.  She is doing 3D 2 to grade C breast density pap smear and high-risk HPV obtained today. On OCPs and refill for the year is done today. FSH, estradiol and AMH obtained today.  We will assess her hormonal status and decide if it is time to stop her OCPs. CBC obtained Referral to GI for screening colonoscopy placed today Return annually or prn

## 2017-09-02 ENCOUNTER — Encounter: Payer: Self-pay | Admitting: Obstetrics & Gynecology

## 2017-09-02 ENCOUNTER — Other Ambulatory Visit (HOSPITAL_COMMUNITY)
Admission: RE | Admit: 2017-09-02 | Discharge: 2017-09-02 | Disposition: A | Discharge status: Home / Self Care | Payer: Managed Care, Other (non HMO) | Source: Ambulatory Visit | Attending: Obstetrics & Gynecology | Admitting: Obstetrics & Gynecology

## 2017-09-02 ENCOUNTER — Ambulatory Visit (INDEPENDENT_AMBULATORY_CARE_PROVIDER_SITE_OTHER): Payer: Managed Care, Other (non HMO) | Admitting: Obstetrics & Gynecology

## 2017-09-02 VITALS — BP 138/86 | HR 100 | Resp 16 | Ht 64.5 in | Wt 168.0 lb

## 2017-09-02 DIAGNOSIS — N951 Menopausal and female climacteric states: Secondary | ICD-10-CM | POA: Insufficient documentation

## 2017-09-02 DIAGNOSIS — Z0000 Encounter for general adult medical examination without abnormal findings: Secondary | ICD-10-CM | POA: Diagnosis not present

## 2017-09-02 DIAGNOSIS — Z01419 Encounter for gynecological examination (general) (routine) without abnormal findings: Secondary | ICD-10-CM | POA: Diagnosis not present

## 2017-09-02 DIAGNOSIS — Z1211 Encounter for screening for malignant neoplasm of colon: Secondary | ICD-10-CM

## 2017-09-02 DIAGNOSIS — Z124 Encounter for screening for malignant neoplasm of cervix: Secondary | ICD-10-CM

## 2017-09-02 MED ORDER — DROSPIRENONE-ETHINYL ESTRADIOL 3-0.02 MG PO TABS: 1.0000 | ORAL_TABLET | Freq: Every day | ORAL | 4 refills | Status: DC

## 2017-09-04 LAB — CYTOLOGY - PAP: HPV (WINDOPATH): NOT DETECTED

## 2017-09-06 LAB — CBC
HEMATOCRIT: 42.7 % (ref 34.0–46.6)
HEMOGLOBIN: 15.3 g/dL (ref 11.1–15.9)
MCH: 30.2 pg (ref 26.6–33.0)
MCHC: 35.8 g/dL — AB (ref 31.5–35.7)
MCV: 84 fL (ref 79–97)
Platelets: 300 10*3/uL (ref 150–379)
RBC: 5.07 x10E6/uL (ref 3.77–5.28)
RDW: 14.3 % (ref 12.3–15.4)
WBC: 8.6 10*3/uL (ref 3.4–10.8)

## 2017-09-06 LAB — ESTRADIOL: Estradiol: 42.9 pg/mL

## 2017-09-06 LAB — ANTI MULLERIAN HORMONE: ANTI-MULLERIAN HORMONE (AMH): 0.023 ng/mL

## 2017-09-06 LAB — FOLLICLE STIMULATING HORMONE: FSH: 44 m[IU]/mL

## 2017-09-08 ENCOUNTER — Telehealth: Payer: Self-pay

## 2017-09-08 DIAGNOSIS — R87610 Atypical squamous cells of undetermined significance on cytologic smear of cervix (ASC-US): Secondary | ICD-10-CM

## 2017-09-08 NOTE — Telephone Encounter (Signed)
As she was off OCPs for 5 days before lab work, ok to restart OCPs and I will check this all again in a year.  She is not SA so does not need to wait until menses to schedule colposcopy.  Ok to proceed with scheduling now.  Thanks.

## 2017-09-08 NOTE — Telephone Encounter (Signed)
-----   Message from Megan Salon, MD sent at 09/06/2017 10:34 PM EST ----- Please let pt know her CBC was normal.  FSH was elevated, estradiol was in a low-ish range, and AMH was low but this was not in full menopause range.  However, OCPs can artificially made these tests look higher so I want her to stop her OCPs after the current pack is completed and repeat the [facility] after 3-4 weeks of being off the pills.  Orders have not been placed.    Her pap showed ASCUS with Neg HR HPV.  She had a LGSIL pap smear last year so needs repeat colposcopy.  I can discuss lab work with her when she comes as well before we make any final decisions about what to do with her OCPs.  Thanks.

## 2017-09-08 NOTE — Telephone Encounter (Signed)
Spoke with patient. Advised of message as seen below from [place]. Patient verbalizes understanding. Patient states that she does not currently have insurance. New insurance begins in June. Declines to schedule before June. Appointment scheduled for 12/11/2017 at 3:30 pm with Dr.Miller.  Instructions given. Motrin 800 mg po x , one hour before appointment with food. Make sure to eat a meal before appointment and drink plenty of fluids. Patient verbalized understanding and will call to reschedule if will be on menses or has any concerns regarding pregnancy. Patient agreeable and verbalized understanding of all instructions.  Order placed.  Routing to [place] for review of scheduling.

## 2017-09-08 NOTE — Telephone Encounter (Signed)
Spoke with patient. Patient states that she had stopped her OCP for over 5 days before her lab work. Asking if she still needs lab recheck. Has not restarted her OCP. Aware she will need to contact the office with the first day of her next menses to schedule colposcopy. Advised will review lab work with [place] and return call.

## 2017-09-09 NOTE — Telephone Encounter (Signed)
[place] with scheduling.  Thanks for update.

## 2017-09-11 ENCOUNTER — Ambulatory Visit: Payer: Self-pay | Admitting: Obstetrics & Gynecology

## 2017-11-02 ENCOUNTER — Encounter: Payer: Self-pay | Admitting: Obstetrics & Gynecology

## 2017-12-07 ENCOUNTER — Telehealth: Payer: Self-pay | Admitting: Obstetrics & Gynecology

## 2017-12-07 NOTE — Telephone Encounter (Signed)
The patient canceled her colposcopy appointment 12/11/17. She stated that she will call later to reschedule.

## 2017-12-08 NOTE — Telephone Encounter (Signed)
Left message to call Erin [name] at [phone]. 

## 2017-12-10 NOTE — Telephone Encounter (Signed)
Patient returned call. Patient advises "everything is up in the air" she will call back with insurance information when she is ready to reschedule. Patient adds "I'm just going through so much" and it will be a month before she can call us back.  Routing to Dr Sabra Heck  cc: Lamont Snowball, RN

## 2017-12-10 NOTE — Telephone Encounter (Signed)
Call placed to get new insurance information.

## 2017-12-11 ENCOUNTER — Ambulatory Visit: Payer: Managed Care, Other (non HMO) | Admitting: Obstetrics & Gynecology

## 2017-12-12 NOTE — Telephone Encounter (Signed)
Please place in recall for one month from now.  Thanks.

## 2017-12-14 NOTE — Telephone Encounter (Signed)
Deloris Ping Dixon please defer for 1 month. Recall placed.  Cc: Lerry Liner  Encounter closed.

## 2018-01-18 ENCOUNTER — Telehealth: Payer: Self-pay | Admitting: *Deleted

## 2018-01-18 NOTE — Telephone Encounter (Signed)
Patient called back. Requests to schedule in September after new insurance effective. Discussed need to proceed ASAP even if need referral to clinic or [facility] to allow patient to proceed with care. Patient agreeable to schedule. Appointment scheduled for 01-19-18 at 1000. Instructed to take ibuprofen 800 mg one hour prior with food.    Please advise if anything additional needed. Will close encounter.

## 2018-01-18 NOTE — Telephone Encounter (Signed)
Call to patient regarding colposcopy.  Started new job last week. Waiting for insurance to become effective first of month.  Advised appointment has been delayed since February and stressed importance of proceeding to evaluate extent of abnormal cells. Patient placed me on hold and then stated she would have to call back.    ASCUS- H pap 09-02-17. Previous pap- LGSIL 08-2016.  Please advise.

## 2018-01-22 ENCOUNTER — Telehealth: Payer: Self-pay | Admitting: *Deleted

## 2018-01-22 NOTE — Telephone Encounter (Signed)
Spoke with patinet, advised Dr. Sabra Heck will be out of the office on 7/26, calling to reschedule colpo. Patient request afternoon appt.   LMP 01/09/18. OCP for contraceptive.   Colpo rescheduled to 7/29 at 3:30pm with Dr. Sabra Heck.   Routing to provider for final review. Patient is agreeable to disposition. Will close encounter.   Cc: Lerry Liner, Magdalene Patricia

## 2018-01-29 ENCOUNTER — Ambulatory Visit: Payer: Self-pay | Admitting: Obstetrics & Gynecology

## 2018-02-01 ENCOUNTER — Ambulatory Visit (INDEPENDENT_AMBULATORY_CARE_PROVIDER_SITE_OTHER): Payer: Self-pay | Admitting: Obstetrics & Gynecology

## 2018-02-01 ENCOUNTER — Encounter: Payer: Self-pay | Admitting: Obstetrics & Gynecology

## 2018-02-01 DIAGNOSIS — R87610 Atypical squamous cells of undetermined significance on cytologic smear of cervix (ASC-US): Secondary | ICD-10-CM

## 2018-02-01 NOTE — Patient Instructions (Signed)

## 2018-02-01 NOTE — Progress Notes (Deleted)
GYNECOLOGY  VISIT  CC:   ***  HPI: 52 y.o. G1P0 Single African American female here for colposcopy.  GYNECOLOGIC HISTORY: Patient's last menstrual period was 01/09/2018. Contraception: abstinence/OCP Menopausal hormone therapy: none  Patient Active Problem List   Diagnosis Date Noted  . Fibroid 03/18/2013  . Anxiety and depression 07/15/2011  . Rapid heart rate 07/15/2011  . 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 FIBROCYSTIC BREAST DISEASE 04/24/2009  . LIPOMA 12/08/2008    Past Medical History:  Diagnosis Date  . Anxiety   . Gonorrhea 1988  . Headache    menstrual  . Sickle cell trait [facility])         Past Surgical History:  Procedure Laterality Date  . ABLATION SAPHENOUS VEIN W/ RFA     Right leg  . BREAST BIOPSY  06/10/2011   Procedure: BREAST BIOPSY WITH NEEDLE LOCALIZATION;  Surgeon: Imogene Burn. Georgette Dover, MD;  Location: [place];  Service: General;  Laterality: Right;  Right needle localized lumpectomy  . BREAST BIOPSY Right 05/2011  . BREAST EXCISIONAL BIOPSY Right 06/10/2011  . MYOMECTOMY N/A 06/27/2013   Procedure: VAGINAL MYOMECTOMY;  Surgeon: Lyman Speller, MD;  Location: [place] [facility];  Service: Gynecology;  Laterality: N/A;  . TOE SURGERY  7/09   broken toe  . WISDOM TOOTH EXTRACTION      MEDS:   Current Outpatient Medications on File Prior to Visit  Medication Sig Dispense Refill  . drospirenone-ethinyl estradiol (LORYNA) 3-0.02 MG tablet Take 1 tablet by mouth daily. 84 tablet 4  . fish oil-omega-3 fatty acids 1000 MG capsule Take 1 g by mouth daily.      Marland Kitchen glucosamine-chondroitin 500-400 MG tablet Take 1 tablet by mouth daily.      . Multiple Vitamin (MULTIVITAMIN) tablet Take 1 tablet by mouth daily.      . NON FORMULARY Testosterone pellet 137.5 mg    . NON FORMULARY Take one to two tablet po daily     No current facility-administered medications on file prior to visit.     ALLERGIES: Prozac [fluoxetine] and  Sulfamethoxazole  Family History  Problem Relation Age of Onset  . Hypertension Mother   . Anxiety disorder Mother   . Tuberculosis Father        ?/not sure  . Heart disease Maternal Grandmother   . Hypertension Sister     SH:  ***  Review of Systems  All other systems reviewed and are negative.   PHYSICAL EXAMINATION:    BP (!) 166/88 (BP Location: Right Arm, Patient Position: Sitting, Cuff Size: Normal)   Pulse (!) 106   Resp 18   Ht 5' 4.5" (1.638 m)   Wt 161 lb 12.8 oz (73.4 kg)   LMP 01/09/2018   BMI 27.34 kg/m     General appearance: alert, cooperative and appears stated age Neck: no adenopathy, supple, symmetrical, trachea midline and thyroid {CHL AMB PHY EX THYROID NORM DEFAULT:[phone]::"normal to inspection and palpation"} CV:  {Exam; heart brief:31539} Lungs:  {pe lungs ob:314451::"clear to auscultation, no wheezes, rales or rhonchi, symmetric air entry"} Breasts: {Exam; breast:13139::"normal appearance, no masses or tenderness"} Abdomen: soft, non-tender; bowel sounds normal; no masses,  no organomegaly  Pelvic: External genitalia:  no lesions              Urethra:  normal appearing urethra with no masses, tenderness or lesions              Bartholins and Skenes:  normal                 Vagina: normal appearing vagina with normal color and discharge, no lesions              Cervix: {CHL AMB PHY EX CERVIX NORM DEFAULT:[phone]::"no lesions"}              Bimanual Exam:  Uterus:  {CHL AMB PHY EX UTERUS NORM [phone]::"normal size, contour, position, consistency, mobility, non-tender"}              Adnexa: {CHL AMB PHY EX ADNEXA NO MASS [phone]::"no mass, fullness, tenderness"}              Rectovaginal: {yes no:314532}.  Confirms.              Anus:  normal sphincter tone, no lesions  Chaperone was present for exam.  Assessment: ***  Plan: ***   ~{NUMBERS; -10-45 JOINT ROM:10287} minutes spent with patient >50% of time was in face  to face discussion of above.

## 2018-02-01 NOTE — Progress Notes (Signed)
52 y.o. Single AAF female here for colposcopy with possible biopsies and/or ECC due to ASCUS-H Pap obtained 09/02/17.  Pt does have hx of +HR HPV but this most recent pap smear was negative for HR HPV.  She has delayed the colposcopy due to change in her job.  Patient's last menstrual period was 01/09/2018.          Sexually active: No.  The current method of family planning is OCP (estrogen/progesterone).     Patient has been counseled about results and procedure.  Risks and benefits have bene reviewed including immediate and/or delayed bleeding, infection, cervical scaring from procedure, possibility of needing additional follow up as well as treatment.  rare risks of missing a lesion discussed as well.  All questions answered.  Pt ready to proceed.  BP (!) 166/88 (BP Location: Right Arm, Patient Position: Sitting, Cuff Size: Normal)   Pulse (!) 106   Resp 18   Ht 5' 4.5" (1.638 m)   Wt 161 lb 12.8 oz (73.4 kg)   LMP 01/09/2018   BMI 27.34 kg/m   Physical Exam  Constitutional: She appears well-developed and well-nourished.  Musculoskeletal: Normal range of motion.  Skin: Skin is warm and dry.  Psychiatric: She has a normal mood and affect.    Speculum placed.  3% acetic acid applied to cervix for >45 seconds.  Cervix visualized with both 7.5X and 15X magnification.  Green filter also used.  Lugols solution was used.  Findings:  No AWE noted.  No abnormal vessels noted.  No abnormal staining with Lugol's solution noted.  Biopsy:  Not obtained.  Transition zone was just endocervical but fully visualized.  ECC:  was not performed.  Monsel's was not needed.  Excellent hemostasis was present.  Pt tolerated procedure well and all instruments were removed.  Findings noted above on picture of cervix.  Assessment:  ASCUS H pap with neg HR HPV  Plan:  Pathology results will be called to patient and follow-up planned pending results.

## 2018-02-15 ENCOUNTER — Telehealth: Payer: Self-pay | Admitting: Obstetrics & Gynecology

## 2018-02-15 ENCOUNTER — Encounter: Payer: Self-pay | Admitting: Obstetrics & Gynecology

## 2018-02-15 NOTE — Telephone Encounter (Signed)
Patient sent the following correspondence through [place]. Routing to triage to assist patient with advice and request.  Hello.  I think I may want to have my hormones checked sooner than February, because I'm not sleeping. And because I'm due for my testosterone pellets soon, may I bring or have the HRT panel(similar to the one attached) from [facility] sent to Dr. Sabra Heck? If it has the same items she's testing for, it would save me some time to use the same blood work. I would still discontinue my birth control pills a week or so before the blood work.    And just to confirm, how soon should I discontinue my birth control pills before the blood work?    If you have questions, you may message me here or call me at [phone].    Thank you,  Erin [name]

## 2018-02-15 NOTE — Telephone Encounter (Signed)
Left message to call Kessa Fairbairn at [phone].  

## 2018-02-15 NOTE — Telephone Encounter (Signed)
It would be best if she could stop her OCPs two weeks before blood work.  I would like her to have the [facility] repeated as well as the estradiol.  These were on the panel she sent me.  If they repeat everything that is on that panel (except maybe thyroid), that would be all the information I need.  Thanks.

## 2018-02-15 NOTE — Telephone Encounter (Signed)
Routing to Dr. Sabra Heck to review and advise.   Attached document printed and to Dr. Sabra Heck to review.

## 2018-02-15 NOTE — Telephone Encounter (Signed)
Spoke with patient, advised as seen below per Dr. Miller. Patient verbalizes understanding and is agreeable.   Encounter closed.  

## 2018-03-10 ENCOUNTER — Encounter: Payer: Self-pay | Admitting: Obstetrics & Gynecology

## 2018-03-11 ENCOUNTER — Telehealth: Payer: Self-pay | Admitting: Obstetrics & Gynecology

## 2018-03-11 NOTE — Telephone Encounter (Signed)
Patient sent the following correspondence through [place]. Routing to triage to assist patient with request.  Hello.  I've attached my HRT panel collected on March 05, 2018. The last day I took my BCP was February 15, 2018.    Thank you,  Erin [name]

## 2018-03-11 NOTE — Telephone Encounter (Signed)
Results printed and to Dr. Sabra Heck for review.

## 2018-03-12 ENCOUNTER — Other Ambulatory Visit: Payer: Self-pay | Admitting: Obstetrics & Gynecology

## 2018-03-12 MED ORDER — NORETHINDRONE 0.35 MG PO TABS: 1.0000 | ORAL_TABLET | Freq: Every day | ORAL | 3 refills | Status: DC

## 2018-03-12 NOTE — Telephone Encounter (Signed)
Called pt personally.  FSH was 18.6.  Advised this is not full menopausal range.  Estradiol level was 243.  Receiving the testosterone pellets only from [facility].  Advised if she want to restart OCPs, this is ok.  She was advised she should use contraception--either OCPs or condoms.  POPs are also an option as well.  She would like to consider her options and will let me know if desires to make a change.  Ok to close encounter.

## 2018-03-12 NOTE — Progress Notes (Signed)
rx for micronor sent to pharmacy.   

## 2018-03-13 ENCOUNTER — Ambulatory Visit: Payer: BC Managed Care – PPO | Admitting: Podiatry

## 2018-03-13 ENCOUNTER — Ambulatory Visit (INDEPENDENT_AMBULATORY_CARE_PROVIDER_SITE_OTHER): Payer: BC Managed Care – PPO

## 2018-03-13 DIAGNOSIS — M2141 Flat foot [pes planus] (acquired), right foot: Secondary | ICD-10-CM

## 2018-03-13 DIAGNOSIS — S93491S Sprain of other ligament of right ankle, sequela: Secondary | ICD-10-CM

## 2018-03-13 DIAGNOSIS — M2142 Flat foot [pes planus] (acquired), left foot: Secondary | ICD-10-CM

## 2018-03-13 DIAGNOSIS — M659 Unspecified synovitis and tenosynovitis, unspecified site: Secondary | ICD-10-CM

## 2018-03-13 DIAGNOSIS — Q666 Other congenital valgus deformities of feet: Secondary | ICD-10-CM | POA: Diagnosis not present

## 2018-03-13 DIAGNOSIS — R269 Unspecified abnormalities of gait and mobility: Secondary | ICD-10-CM | POA: Diagnosis not present

## 2018-03-13 DIAGNOSIS — M65971 Unspecified synovitis and tenosynovitis, right ankle and foot: Secondary | ICD-10-CM

## 2018-03-13 DIAGNOSIS — G8929 Other chronic pain: Secondary | ICD-10-CM

## 2018-03-13 DIAGNOSIS — M25571 Pain in right ankle and joints of right foot: Secondary | ICD-10-CM | POA: Diagnosis not present

## 2018-03-13 MED ORDER — MELOXICAM 15 MG PO TABS: 15.0000 mg | ORAL_TABLET | Freq: Every day | ORAL | 0 refills | Status: DC

## 2018-03-13 NOTE — Progress Notes (Signed)
Subjective:  Patient ID: Erin [name], female    DOB: 02-15-1966,  MRN: [id number]  Chief Complaint  Patient presents with  . Foot Pain    right ankle pain    52 y.o. female presents with the above complaint. Reports pain in hte right ankle area x6 weeks. Denies injury. Reports pain also at the lateral edge of her foot and ankle. Complains her feet pronate. Likes to stay active and the pain prevents her from doing so.  Review of Systems: Negative except as noted in the HPI. Denies N/V/F/Ch.  Past Medical History:  Diagnosis Date  . Anxiety   . Gonorrhea 1988  . Headache    menstrual  . Sickle cell trait (HCC)         Current Outpatient Medications:  .  fish oil-omega-3 fatty acids 1000 MG capsule, Take 1 g by mouth daily.  , Disp: , Rfl:  .  glucosamine-chondroitin 500-400 MG tablet, Take 1 tablet by mouth daily.  , Disp: , Rfl:  .  meloxicam (MOBIC) 15 MG tablet, Take 1 tablet (15 mg total) by mouth daily., Disp: 30 tablet, Rfl: 0 .  Multiple Vitamin (MULTIVITAMIN) tablet, Take 1 tablet by mouth daily.  , Disp: , Rfl:  .  NON FORMULARY, Testosterone pellet 137.5 mg, Disp: , Rfl:  .  NON FORMULARY, Take one to two tablet po daily, Disp: , Rfl:  .  norethindrone (MICRONOR,CAMILA,ERRIN) 0.35 MG tablet, Take 1 tablet (0.35 mg total) by mouth daily., Disp: 1 Package, Rfl: 3  Social History   Tobacco Use  Smoking Status Never Smoker  Smokeless Tobacco Never Used    Allergies  Allergen Reactions  . Prozac [Fluoxetine]     Suicidal thoughts and depression  . Sulfamethoxazole     REACTION: hives   Objective:  There were no vitals filed for this visit. There is no height or weight on file to calculate BMI. Constitutional Well developed. Well nourished.  Vascular Dorsalis pedis pulses palpable bilaterally. Posterior tibial pulses palpable bilaterally. Capillary refill normal to all digits.  No cyanosis or clubbing noted. Pedal hair growth normal.  Neurologic Normal  speech. Oriented to person, place, and time. Epicritic sensation to light touch grossly present bilaterally.  Dermatologic Nails well groomed and normal in appearance. No open wounds. No skin lesions.  Orthopedic: Normal joint ROM without pain or crepitus bilaterally. POP R STJ but no pain on ROM POP R ATFL Pes planus with hindfoot valgus   Radiographs: Taken and reviewed. Pes planus. Hindfoot valgus. No significant degenerative changes.  Assessment:   1. Synovitis of right ankle   2. Sprain of anterior talofibular ligament of right ankle, sequela   3. Synovitis and tenosynovitis   4. Chronic pain of right ankle   5. Pes planus of both feet   6. Congenital hindfoot valgus   7. Sinus tarsi syndrome of right ankle   8. Gait disturbance    Plan:  Patient was evaluated and treated and all questions answered.  Pes Planus with Sinus Tarsitis, Ankle Valgus -XR reviewed as above. -STJ injection as below. -Rx Meloxicam. Educated on proper taking of med and risks/benefits. -Would benefit from CMOs. Casted today. -Trilock brace dispensed to immobilize ankle until CMOs come in.  Procedure: Joint Injection Location: Right STJ joint Skin Prep: Alcohol. Injectate: 1 cc 0.5% marcaine plain, 1  cc dexamethasone phosphate, 0.5 cc Kenalog 10 Disposition: Patient tolerated procedure well. Injection site dressed with a band-aid.  Return in about 3 weeks (around  04/03/2018) for Sinus Tarsitis R.

## 2018-03-30 ENCOUNTER — Ambulatory Visit: Payer: BC Managed Care – PPO | Admitting: Orthotics

## 2018-03-30 DIAGNOSIS — S93491S Sprain of other ligament of right ankle, sequela: Secondary | ICD-10-CM

## 2018-03-30 DIAGNOSIS — M659 Synovitis and tenosynovitis, unspecified: Secondary | ICD-10-CM

## 2018-03-30 NOTE — Progress Notes (Signed)
Patient came in today to pick up custom made foot orthotics.  The goals were accomplished and the patient reported no dissatisfaction with said orthotics.  Patient was advised of breakin period and how to report any issues. 

## 2018-04-03 ENCOUNTER — Other Ambulatory Visit: Payer: Self-pay | Admitting: Podiatry

## 2018-04-13 ENCOUNTER — Telehealth: Payer: Self-pay | Admitting: Obstetrics & Gynecology

## 2018-04-13 NOTE — Telephone Encounter (Addendum)
Can speak with any nurse at return call.  Message left to return call Just started Micronor.  Weight gain can be side effect.  Give 3 months to stabilize with bloating or needs office visit to discuss other options with Dr. Sabra Heck as not menopausal and needs contraception.

## 2018-04-13 NOTE — Telephone Encounter (Signed)
Patient stated that since starting the norethindrone, she has been feeling "very bloated."

## 2018-04-14 NOTE — Telephone Encounter (Signed)
It's ok for her to stop the micronor if feeling side effects.  She was not SA at last OV.  [place] was 18 with lab work at [facility] so she does need to use condoms if SA.

## 2018-04-14 NOTE — Telephone Encounter (Signed)
Returned call to patient.  She states she started Micronor on 03/28/18 and reports feeling "very bloated" since starting the pills. She started these pills with the first day of her cycle as directed.  Advised since new start of pills, may need to give time for cycles/weight to normalize. Asked if it was weight gain that she felt or bloating in abdominal area.  Attempted to triage further, however,pt states she will wait to see how she feels and return call.  Routing to Dr. Sabra Heck for review if further advice needed as patient had recent labs [facility], but last physical exam 09/02/17. Annual with Dr. Sabra Heck scheduled with for 09/2018.

## 2018-04-14 NOTE — Telephone Encounter (Signed)
Patient returning call.

## 2018-04-16 NOTE — Telephone Encounter (Signed)
Spoke with patient. She states she is going to continue on pills and aware she can stop them if she desires but will need a back up method for birth control. She is going to work on lifestyle changes as well. She is advised to call back with any questions or concerns.  Encounter closed.

## 2018-05-29 ENCOUNTER — Other Ambulatory Visit: Payer: Self-pay | Admitting: Obstetrics & Gynecology

## 2018-05-31 NOTE — Telephone Encounter (Signed)
Medication refill request: micronor Last AEX:  09-02-17 Next AEX: 09-07-18 Last MMG (if hormonal medication request): 05-25-17 category c density birads 1:neg Refill authorized: pt is not due for a refill yet. Patient started 1st pack of pills 03-28-18 & was given 1 with 3 refills. rx denied

## 2018-06-10 DIAGNOSIS — L669 Cicatricial alopecia, unspecified: Secondary | ICD-10-CM | POA: Insufficient documentation

## 2018-06-18 ENCOUNTER — Other Ambulatory Visit: Payer: Self-pay | Admitting: Obstetrics & Gynecology

## 2018-06-18 DIAGNOSIS — Z1231 Encounter for screening mammogram for malignant neoplasm of breast: Secondary | ICD-10-CM

## 2018-06-25 ENCOUNTER — Telehealth: Payer: Self-pay | Admitting: Obstetrics & Gynecology

## 2018-06-25 NOTE — Telephone Encounter (Signed)
Patient sent the following correspondence through [place]. Routing to triage to assist patient with request.  Appointment Request From: Erin [name]    With Provider: Megan Salon, MD [facility]]    Preferred Date Range: Any date 06/25/2018 or later    Preferred Times: Any    Reason: To address the following health maintenance concerns.  Colonoscopy    Comments:  Hello.  I had a flu shot in September at [facility] [place], [place]. Also, I've been meaning to address the letter from [facility] at [facility]. I did contact that office. At one time I asked about Cologuard, and someone told me that I needed a prescription.   How do I go about getting a prescription for the Cologuard?     Thank you.  [name]

## 2018-06-25 NOTE — Telephone Encounter (Signed)
Immunizations updated.   Referral placed to [facility] 09/02/17 for colon cancer screening. Appt not scheudled to date.  Routing to Dr. Sabra Heck to advise on Cologuard, ok to proceed?

## 2018-06-28 ENCOUNTER — Ambulatory Visit
Admission: RE | Admit: 2018-06-28 | Discharge: 2018-06-28 | Disposition: A | Discharge status: Home / Self Care | Payer: Managed Care, Other (non HMO) | Source: Ambulatory Visit

## 2018-06-28 DIAGNOSIS — Z1231 Encounter for screening mammogram for malignant neoplasm of breast: Secondary | ICD-10-CM

## 2018-07-01 ENCOUNTER — Other Ambulatory Visit: Payer: Self-pay | Admitting: Obstetrics & Gynecology

## 2018-07-01 DIAGNOSIS — R928 Other abnormal and inconclusive findings on diagnostic imaging of breast: Secondary | ICD-10-CM

## 2018-07-01 NOTE — Telephone Encounter (Signed)
Cologuard is not covered by [employer] to the best of my knowledge so this would be a completely out of pocket cost for her--~$700.  If she is ok with this, I am happy to order the cologuard for her.

## 2018-07-02 NOTE — Telephone Encounter (Signed)
Left message to call Alois Colgan, RN at [facility] [phone].   

## 2018-07-02 NOTE — Telephone Encounter (Signed)
Spoke with patient, advised as seen below per Dr. Sabra Heck. Patient declines order for cologuard at this time. Patient declines referral to GI at this time. Patient aware to return call if any additional assistance needed.  Routing to provider for final review. Patient is agreeable to disposition. Will close encounter.  Cc: Magdalene Patricia

## 2018-07-06 ENCOUNTER — Ambulatory Visit
Admission: RE | Admit: 2018-07-06 | Discharge: 2018-07-06 | Disposition: A | Discharge status: Home / Self Care | Payer: BC Managed Care – PPO | Source: Ambulatory Visit | Attending: Obstetrics & Gynecology | Admitting: Obstetrics & Gynecology

## 2018-07-06 ENCOUNTER — Other Ambulatory Visit: Payer: Self-pay | Admitting: Obstetrics & Gynecology

## 2018-07-06 DIAGNOSIS — R928 Other abnormal and inconclusive findings on diagnostic imaging of breast: Secondary | ICD-10-CM

## 2018-07-06 DIAGNOSIS — N63 Unspecified lump in unspecified breast: Secondary | ICD-10-CM

## 2018-07-30 ENCOUNTER — Telehealth: Payer: Self-pay | Admitting: Obstetrics & Gynecology

## 2018-07-30 NOTE — Telephone Encounter (Signed)
Spoke with patient. She requests an appointment to discuss her hormones and symptoms of "not feeling herself." She states that she thinks her hormones are "off".  Requests early February due to her schedule.  She denies any thoughts of self harm or harm to anyone else. She states she will go to the Emergency room if any concerns about her safety. Advised to call back if desires earlier appointment with Dr. Sabra Heck.  Consult appointment scheduled for 08/09/2018.  Encounter closed.

## 2018-07-30 NOTE — Telephone Encounter (Signed)
Patient called and left a message at lunch and said she'd like an appointment for the first week in February to check her hormones. She said she has been feeling a little wonky, depressed, and anxious and wants to get to the bottom of the problem. Routing to triage for further assessment and scheduling.

## 2018-08-02 ENCOUNTER — Telehealth: Payer: Self-pay | Admitting: *Deleted

## 2018-08-02 NOTE — Telephone Encounter (Signed)
Patient left message on answering machine wanting to talk to nurse about her hormone medication and if she should schedule appointment for this week.

## 2018-08-02 NOTE — Telephone Encounter (Signed)
Spoke with patient.  Has consult with Dr. Sabra Heck scheduled 08/09/2018.  She states she picked up her micronor and started it on 1/22, skipped a dose on 1/23 and took another tablet on 1/24.  Has not taken any further doses. States she stopped progesterone only pills in December because she felt like she was "always spotting." Not sexually active. Did not feel she needed to take them.   Had blood work done at [facility] on 07/26/2018.  Due for a testosterone pellet at [facility] in February.   Advised patient not to start and stop OCP as that will contribute to irregular bleeding and hormonal imbalance.   Patient states she will keep her appointment with Dr. Sabra Heck as scheduled for 08/09/2018 and will continue to monitor her symptoms.   update to Dr. Sabra Heck. Encounter closed.

## 2018-08-06 ENCOUNTER — Other Ambulatory Visit: Payer: Self-pay

## 2018-08-09 ENCOUNTER — Encounter: Payer: Self-pay | Admitting: Obstetrics & Gynecology

## 2018-08-09 ENCOUNTER — Other Ambulatory Visit: Payer: Self-pay

## 2018-08-09 ENCOUNTER — Ambulatory Visit (INDEPENDENT_AMBULATORY_CARE_PROVIDER_SITE_OTHER): Payer: BC Managed Care – PPO | Admitting: Obstetrics & Gynecology

## 2018-08-09 VITALS — BP 148/92 | HR 106 | Resp 16 | Ht 64.5 in | Wt 163.8 lb

## 2018-08-09 DIAGNOSIS — N926 Irregular menstruation, unspecified: Secondary | ICD-10-CM | POA: Diagnosis not present

## 2018-08-09 DIAGNOSIS — G4709 Other insomnia: Secondary | ICD-10-CM | POA: Diagnosis not present

## 2018-08-09 DIAGNOSIS — R4586 Emotional lability: Secondary | ICD-10-CM

## 2018-08-09 MED ORDER — ESTRADIOL 0.075 MG/24HR TD PTTW: 1.0000 | MEDICATED_PATCH | TRANSDERMAL | 12 refills | Status: DC

## 2018-08-09 NOTE — Progress Notes (Signed)
GYNECOLOGY  VISIT  CC:   Insomnia , depression, anxiety  HPI: 53 y.o. G1P0 Single Black or [place] American female here for hormone questions.  Reports she's having issues like she did about four or five years ago when she had menopausal symptoms.  She was on HRT for awhile but then her ovarian function improved and she has not been on HRT for any time since.    Reports she started having bleeding changes in the fall.  She was started on micronor for this but only took it for a short time in early December.  Cycled after stopped the micronor from 06/16/18 - 06/19/18.  This was very light.  She has not had bleeding since that time.    She is having more issues with sleep and anxiety.  States she can get to sleep but then wakes up and just can't get back to sleep.  She's also having more emotional issues.  Feels edgy and teary at times that don't really make sense.  Is anxious these symptoms have returned after years of not having them.  Just doesn't feel like herself.  She's been going to [facility] for testosterone pellet placements.  Did have an [place] in January and this was elevated in menopausal range.    GYNECOLOGIC HISTORY: Patient's last menstrual period was 06/16/2018 (exact date). Contraception: abstinence  Menopausal hormone therapy: none  Patient Active Problem List   Diagnosis Date Noted  . Fibroid 03/18/2013  . Anxiety and depression 07/15/2011  . Rapid heart rate 07/15/2011  . 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 FIBROCYSTIC BREAST DISEASE 04/24/2009  . LIPOMA 12/08/2008    Past Medical History:  Diagnosis Date  . Anxiety   . Gonorrhea 1988  . Headache    menstrual  . Sickle cell trait [facility])         Past Surgical History:  Procedure Laterality Date  . ABLATION SAPHENOUS VEIN W/ RFA     Right leg  . BREAST BIOPSY  06/10/2011   Procedure: BREAST BIOPSY WITH NEEDLE LOCALIZATION;  Surgeon: Imogene Burn. Georgette Dover, MD;  Location: [place];   Service: General;  Laterality: Right;  Right needle localized lumpectomy  . BREAST BIOPSY Right 05/2011  . BREAST EXCISIONAL BIOPSY Right 06/10/2011  . MYOMECTOMY N/A 06/27/2013   Procedure: VAGINAL MYOMECTOMY;  Surgeon: Lyman Speller, MD;  Location: [facility];  Service: Gynecology;  Laterality: N/A;  . TOE SURGERY  7/09   broken toe  . WISDOM TOOTH EXTRACTION      MEDS:   Current Outpatient Medications on File Prior to Visit  Medication Sig Dispense Refill  . clobetasol (OLUX) 0.05 % topical foam     . fish oil-omega-3 fatty acids 1000 MG capsule Take 1 g by mouth daily.      Marland Kitchen glucosamine-chondroitin 500-400 MG tablet Take 1 tablet by mouth daily.      . Multiple Vitamin (MULTIVITAMIN) tablet Take 1 tablet by mouth daily.      . NON FORMULARY Testosterone pellet 137.5 mg    . NON FORMULARY Take one to two tablet po daily     No current facility-administered medications on file prior to visit.     ALLERGIES: Prozac [fluoxetine] and Sulfamethoxazole  Family History  Problem Relation Age of Onset  . Hypertension Mother   . Anxiety disorder Mother   . Tuberculosis Father        ?/not sure  . Heart disease Maternal Grandmother   .  Hypertension Sister     SH:  Single, non smoker  Review of Systems  Psychiatric/Behavioral: The patient is nervous/anxious.        Depression  Excess crying   All other systems reviewed and are negative.   PHYSICAL EXAMINATION:    BP (!) 148/92 (BP Location: Right Arm, Patient Position: Sitting, Cuff Size: Normal)   Pulse (!) 106   Resp 16   Ht 5' 4.5" (1.638 m)   Wt 163 lb 12.8 oz (74.3 kg)   LMP 06/16/2018 (Exact Date)   BMI 27.68 kg/m     Physical Exam  Constitutional: She appears well-developed and well-nourished.  Skin: Skin is warm and dry.  Psychiatric: She has a normal mood and affect. Her behavior is normal. Judgment and thought content normal.   Assessment: Cycle changes Mood changes Sleep disturbance Possible  menopausal symptoms Elevated BP today  Plan: Pt is going to continue check her blood pressures at home and write down values for me to review Will start estradiol patch 0.075mg  twice weekly.  Rx to pharmacy.  Pt aware will need progesterone but will start with estrogen only right now.  She is going to give me an update in one week.  Her AEX is scheduled in one month and she will keep this appt.   ~30 minutes spent with patient >50% of time was in face to face discussion of above.

## 2018-08-10 ENCOUNTER — Encounter: Payer: Self-pay | Admitting: Obstetrics & Gynecology

## 2018-08-13 ENCOUNTER — Encounter: Payer: Self-pay | Admitting: Obstetrics & Gynecology

## 2018-08-16 ENCOUNTER — Other Ambulatory Visit: Payer: Self-pay | Admitting: Obstetrics & Gynecology

## 2018-08-20 ENCOUNTER — Other Ambulatory Visit: Payer: Self-pay | Admitting: Obstetrics & Gynecology

## 2018-08-20 ENCOUNTER — Telehealth: Payer: Self-pay | Admitting: Obstetrics & Gynecology

## 2018-08-20 ENCOUNTER — Encounter: Payer: Self-pay | Admitting: Obstetrics & Gynecology

## 2018-08-20 MED ORDER — ZOLPIDEM TARTRATE 5 MG PO TABS: ORAL_TABLET | ORAL | 0 refills | Status: DC

## 2018-08-20 NOTE — Telephone Encounter (Signed)
Routing multiple messages to Dr. Sabra Heck to review.  Pt was to provide update after starting patches.  Annual exam scheduled for 09/07/2018 to follow up.

## 2018-08-20 NOTE — Telephone Encounter (Signed)
Responded to pt via mychart.  Ok to close encounter.

## 2018-08-20 NOTE — Progress Notes (Signed)
Called pt.  Having trouble sleeping and when wake up in the middle of the night, starts to have anxiety about not sleeping.  Then she just can't get back to sleep.  Overall, she is feeling better after starting HRT but know she has a few more weeks until has full improvement from current dosage.  Options for symptoms including sleep medication vs short term anxiety medication discussed.  Addition potential discussed.  She would like to try Ambien 5-10mg  nightly prn for next few nights to see how this helps with sleep.  Feels if gets better sleep, can deal with anxiety.  Rx to pharmacy.  She is going to give me another update in a few days.

## 2018-08-20 NOTE — Telephone Encounter (Signed)
Patient sent the following correspondence through [place]. Routing to triage to assist patient with request.  Hi Dr. Sabra Heck.   I'm following up since I started wearing the estrogen patch. I kept a journal. On 2/4, I felt good. 2/5-2-7, I felt better, but still not myself. I woke in the middle of the night. 2/8, I had moderate anxiety all day and was uncomfortable. 2/9, Anxiety and depression. I tried to nap, because I was tired from not sleeping, but wasn't able to nap. This week has been better.   Overall I do feel better, but I still wake at night. And, when I wake, there is anxiety. Is there anything that would help for sleeping all night?   And  Hi Dr. Sabra Heck.   I'm following up since I started wearing the estrogen patch; I kept a journal. On 2/4, I felt good. 2/5-2-7, I felt better, but still not myself. I woke in the middle of the night. 2/8, I had moderate anxiety all day and was uncomfortable. 2/9, Anxiety and depression. I tried to nap, because I was tired from not sleeping, but wasn't able to nap. This week has been better.  Overall I do feel better (not all the way myself, but better), and I still wake at night. Also, most mornings, I have anxiety and nervousness in the pit of my stomach. Is there anything that would help for sleeping all night and completely kick the anxiety AND get me back to myself?    Thank you!   And (See previous, closed telephone encounter.)  Hello.   I've found out that we don't need original signatures. So, can you attach it here? The fax in our department is not currently working. If you're not able to attach the form here, I'll find another department for you to fax it to and send you that fax number.     Thank you!  Erin [name]  ----- Message -----  From: Nurse Kallie Edward  Sent: 08/13/2018 4:02 PM EST  To: Erin [name]  Subject: RE: Non-Urgent Medical Question  Dr. Sabra Heck completed the form and it is ready for you.   Would you like [place] to fax it somewhere  or pick up here in the office?     ----- Message -----   From: Erin [name]   Sent: 08/13/2018 2:12 PM EST    To: Megan Salon, MD  Subject: Non-Urgent Medical Question    Hello.  Is the attached form something Dr. Sabra Heck would be able to complete for me for work? The offices here are ice cold many days, and they require [place] to have forms completed by our doctors in order for [place] to have heaters. I don't want to be sick or make anything up, but if she could complete it, that would be great.    Thank you,  Erin [name]

## 2018-08-25 LAB — COLOGUARD: Cologuard: NEGATIVE

## 2018-09-07 ENCOUNTER — Other Ambulatory Visit: Payer: Self-pay

## 2018-09-07 ENCOUNTER — Ambulatory Visit: Payer: BC Managed Care – PPO | Admitting: Obstetrics & Gynecology

## 2018-09-07 ENCOUNTER — Encounter: Payer: Self-pay | Admitting: Obstetrics & Gynecology

## 2018-09-07 ENCOUNTER — Other Ambulatory Visit (HOSPITAL_COMMUNITY)
Admission: RE | Admit: 2018-09-07 | Discharge: 2018-09-07 | Disposition: A | Discharge status: Home / Self Care | Payer: BC Managed Care – PPO | Source: Ambulatory Visit | Attending: Obstetrics & Gynecology | Admitting: Obstetrics & Gynecology

## 2018-09-07 VITALS — BP 146/90 | HR 88 | Resp 16 | Ht 64.5 in | Wt 164.0 lb

## 2018-09-07 DIAGNOSIS — Z01419 Encounter for gynecological examination (general) (routine) without abnormal findings: Secondary | ICD-10-CM | POA: Diagnosis not present

## 2018-09-07 DIAGNOSIS — Z23 Encounter for immunization: Secondary | ICD-10-CM | POA: Diagnosis not present

## 2018-09-07 DIAGNOSIS — R030 Elevated blood-pressure reading, without diagnosis of hypertension: Secondary | ICD-10-CM | POA: Diagnosis not present

## 2018-09-07 DIAGNOSIS — Z0000 Encounter for general adult medical examination without abnormal findings: Secondary | ICD-10-CM

## 2018-09-07 DIAGNOSIS — Z124 Encounter for screening for malignant neoplasm of cervix: Secondary | ICD-10-CM | POA: Insufficient documentation

## 2018-09-07 DIAGNOSIS — R87610 Atypical squamous cells of undetermined significance on cytologic smear of cervix (ASC-US): Secondary | ICD-10-CM | POA: Insufficient documentation

## 2018-09-07 HISTORY — DX: Elevated blood-pressure reading, without diagnosis of hypertension: R03.0

## 2018-09-07 MED ORDER — PROGESTERONE MICRONIZED 200 MG PO CAPS: ORAL_CAPSULE | ORAL | 1 refills | Status: DC

## 2018-09-07 MED ORDER — ESCITALOPRAM OXALATE 10 MG PO TABS: 10.0000 mg | ORAL_TABLET | Freq: Every day | ORAL | 1 refills | Status: DC

## 2018-09-07 NOTE — Progress Notes (Signed)
53 y.o. G1P0 Single Black or [place] American female here for annual exam.  Started HRT about a month ago.  She is having some anxiety still and this seems the worse in the morning.  Depression symptoms are better but not fully resolved.    Felt abnormal the night before her cycle started which was 08/29/2018.  States she just didn't feel "right" and then her cycle started and she normalized.    Blood pressures at home have been totally normal until today when BP was 140/101.  BP here was 146/90.  BP have been 115/81, 103/66, 110/69, 117/79, 107/64, 103/71, 103/63.  Patient's last menstrual period was 06/16/2018 (exact date).          Sexually active: No  The current method of family planning is post menopausal status.    Exercising: Yes.    walk Smoker:  no  Health Maintenance: Pap:  09/02/17 ASC-H. HR HPV:neg   08/28/16 LSIL. HR HPV: +detected. Colpo: CIN I History of abnormal Pap:  yes MMG:  07/06/18 [place] Left BIRADS3:Probably benign.  F/u 6 months. Has appt 01/13/19 Colonoscopy:  Cologuard done 08/2018 BMD:   Never  TDaP:  2010 Pneumonia vaccine(s):  n/a Shingrix:   No Hep C testing: n/a Screening Labs: Lipids    reports that she has never smoked. She has never used smokeless tobacco. She reports current alcohol use. She reports that she does not use drugs.  Past Medical History:  Diagnosis Date  . Anxiety   . Gonorrhea 1988  . Headache    menstrual  . Sickle cell trait [facility])         Past Surgical History:  Procedure Laterality Date  . ABLATION SAPHENOUS VEIN W/ RFA     Right leg  . BREAST BIOPSY  06/10/2011   Procedure: BREAST BIOPSY WITH NEEDLE LOCALIZATION;  Surgeon: Imogene Burn. Georgette Dover, MD;  Location: [place];  Service: General;  Laterality: Right;  Right needle localized lumpectomy  . BREAST BIOPSY Right 05/2011  . BREAST EXCISIONAL BIOPSY Right 06/10/2011  . MYOMECTOMY N/A 06/27/2013   Procedure: VAGINAL MYOMECTOMY;  Surgeon: Lyman Speller, MD;  Location: [facility] [facility];   Service: Gynecology;  Laterality: N/A;  . TOE SURGERY  7/09   broken toe  . WISDOM TOOTH EXTRACTION      Current Outpatient Medications  Medication Sig Dispense Refill  . estradiol (VIVELLE-DOT) 0.075 MG/24HR Place 1 patch onto the skin 2 (two) times a week. 8 patch 12  . fish oil-omega-3 fatty acids 1000 MG capsule Take 1 g by mouth daily.      Marland Kitchen glucosamine-chondroitin 500-400 MG tablet Take 1 tablet by mouth daily.      . Multiple Vitamin (MULTIVITAMIN) tablet Take 1 tablet by mouth daily.      . NON FORMULARY Testosterone pellet 137.5 mg    . zolpidem (AMBIEN) 5 MG tablet 1-2 tablets qhs prn insomnia 30 tablet 0   No current facility-administered medications for this visit.     Family History  Problem Relation Age of Onset  . Hypertension Mother   . Anxiety disorder Mother   . Tuberculosis Father        ?/not sure  . Heart disease Maternal Grandmother   . Hypertension Sister     Review of Systems  Psychiatric/Behavioral: The patient is nervous/anxious.        Depression   All other systems reviewed and are negative.   Exam:   BP (!) 146/90 (BP Location: Left Arm, Patient Position:  Sitting, Cuff Size: Large)   Pulse 88   Resp 16   Ht 5' 4.5" (1.638 m)   Wt 164 lb (74.4 kg)   LMP 06/16/2018 (Exact Date)   BMI 27.72 kg/m     Height: 5' 4.5" (163.8 cm)  Ht Readings from Last 3 Encounters:  09/07/18 5' 4.5" (1.638 m)  08/09/18 5' 4.5" (1.638 m)  02/01/18 5' 4.5" (1.638 m)    General appearance: alert, cooperative and appears stated age Head: Normocephalic, without obvious abnormality, atraumatic Neck: no adenopathy, supple, symmetrical, trachea midline and thyroid normal to inspection and palpation Lungs: clear to auscultation bilaterally Breasts: normal appearance, no masses or tenderness Heart: regular rate and rhythm Abdomen: soft, non-tender; bowel sounds normal; no masses,  no organomegaly Extremities: extremities normal, atraumatic, no cyanosis or  edema Skin: Skin color, texture, turgor normal. No rashes or lesions Lymph nodes: Cervical, supraclavicular, and axillary nodes normal. No abnormal inguinal nodes palpated Neurologic: Grossly normal   Pelvic: External genitalia:  no lesions              Urethra:  normal appearing urethra with no masses, tenderness or lesions              Bartholins and Skenes: normal                 Vagina: normal appearing vagina with normal color and discharge, no lesions              Cervix: no lesions              Pap taken: Yes.   Bimanual Exam:  Uterus:  normal size, contour, position, consistency, mobility, non-tender              Adnexa: normal adnexa and no mass, fullness, tenderness               Rectovaginal: Confirms               Anus:  normal sphincter tone, no lesions  Chaperone was present for exam.  A:  Well Woman with normal exam PMP, newly on estradiol patch.  Adding progesterone today H/o ASCUS H pap with neg HR HPV 2/19, ECC negative Anxiety  Mild insomnia No SA H/O intraductal papilloma s/p excision 12/12 on left White coat hypertension (blood pressures at home have all been normal)  P:   Mammogram guidelines reviewed.  Has follow up scheduled 01/2019 Add Prometrium 200mg  nightly days 1-14 each month Consider adding Lexapro 10mg  daily.  Will wait until she has taken the Prometrium this month pap smear with HR HPV obtained today CBC, CMP, Vit D, Lipids Tdap updated today Declines colonoscopy but did cologuard 08/2018 return annually or prn

## 2018-09-08 ENCOUNTER — Other Ambulatory Visit: Payer: Self-pay | Admitting: Obstetrics & Gynecology

## 2018-09-08 LAB — COMPREHENSIVE METABOLIC PANEL
ALBUMIN: 4.1 g/dL (ref 3.8–4.9)
ALT: 14 IU/L (ref 0–32)
AST: 23 IU/L (ref 0–40)
Albumin/Globulin Ratio: 1.4 (ref 1.2–2.2)
Alkaline Phosphatase: 67 IU/L (ref 39–117)
BILIRUBIN TOTAL: 0.3 mg/dL (ref 0.0–1.2)
BUN / CREAT RATIO: 11 (ref 9–23)
BUN: 7 mg/dL (ref 6–24)
CO2: 21 mmol/L (ref 20–29)
CREATININE: 0.63 mg/dL (ref 0.57–1.00)
Calcium: 8.7 mg/dL (ref 8.7–10.2)
Chloride: 103 mmol/L (ref 96–106)
GFR calc non Af Amer: 104 mL/min/{1.73_m2} (ref >59)
GFR, EST AFRICAN AMERICAN: 120 mL/min/{1.73_m2} (ref >59)
GLUCOSE: 78 mg/dL (ref 65–99)
Globulin, Total: 3 g/dL (ref 1.5–4.5)
Potassium: 3.8 mmol/L (ref 3.5–5.2)
Sodium: 140 mmol/L (ref 134–144)
TOTAL PROTEIN: 7.1 g/dL (ref 6.0–8.5)

## 2018-09-08 LAB — CBC
HEMATOCRIT: 40.3 % (ref 34.0–46.6)
Hemoglobin: 13.4 g/dL (ref 11.1–15.9)
MCH: 28.8 pg (ref 26.6–33.0)
MCHC: 33.3 g/dL (ref 31.5–35.7)
MCV: 87 fL (ref 79–97)
Platelets: 296 10*3/uL (ref 150–450)
RBC: 4.65 x10E6/uL (ref 3.77–5.28)
RDW: 15 % (ref 11.7–15.4)
WBC: 8.3 10*3/uL (ref 3.4–10.8)

## 2018-09-08 LAB — LIPID PANEL
CHOLESTEROL TOTAL: 162 mg/dL (ref 100–199)
Chol/HDL Ratio: 2.7 ratio (ref 0.0–4.4)
HDL: 61 mg/dL (ref >39)
LDL Calculated: 92 mg/dL (ref 0–99)
Triglycerides: 46 mg/dL (ref 0–149)
VLDL CHOLESTEROL CAL: 9 mg/dL (ref 5–40)

## 2018-09-08 LAB — VITAMIN D 25 HYDROXY (VIT D DEFICIENCY, FRACTURES): Vit D, 25-Hydroxy: 58.6 ng/mL (ref 30.0–100.0)

## 2018-09-09 ENCOUNTER — Other Ambulatory Visit: Payer: Self-pay | Admitting: *Deleted

## 2018-09-09 ENCOUNTER — Telehealth: Payer: Self-pay | Admitting: *Deleted

## 2018-09-09 ENCOUNTER — Other Ambulatory Visit: Payer: Self-pay | Admitting: Obstetrics & Gynecology

## 2018-09-09 DIAGNOSIS — Z1212 Encounter for screening for malignant neoplasm of rectum: Secondary | ICD-10-CM

## 2018-09-09 DIAGNOSIS — Z1211 Encounter for screening for malignant neoplasm of colon: Secondary | ICD-10-CM

## 2018-09-09 LAB — CYTOLOGY - PAP
Diagnosis: NEGATIVE
HPV: NOT DETECTED

## 2018-09-09 NOTE — Telephone Encounter (Signed)
Detailed message left per DPR advising patient of negative cologuard results. Advised patient to return call if any additional questions.   Encounter closed.

## 2018-09-10 ENCOUNTER — Telehealth: Payer: Self-pay | Admitting: *Deleted

## 2018-09-10 NOTE — Telephone Encounter (Signed)
LM for pt to call back. Please route to Triage if I am not available.   08 recall placed. Needs to reschedule AEX appt from 01/2020 to 09/2019.

## 2018-09-10 NOTE — Telephone Encounter (Signed)
-----   Message from Megan Salon, MD sent at 09/09/2018  5:20 PM EST ----- Please let pt know her pap was normal and HR HPV was negative.  Out of pap recall.  Needs new 08 recall and pap with HR HPV 1 year.    Also let her know her CBC, CMP, Lipid panel, and Vit D were all normal.  This is great!!  She is going to let me know in about a month if she starts the Lexapro after she's added the progesterone.  Please remind her about this.  Thanks.

## 2018-09-10 NOTE — Telephone Encounter (Signed)
Spoke with patient.  Message given.  She states she is feeling improved already but will give [place] update.  08 recall in place. Annual exam moved up to 09/2019.  Encounter closed.

## 2018-10-06 ENCOUNTER — Encounter: Payer: Self-pay | Admitting: Obstetrics & Gynecology

## 2018-10-06 ENCOUNTER — Telehealth: Payer: Self-pay | Admitting: Obstetrics & Gynecology

## 2018-10-06 NOTE — Telephone Encounter (Signed)
Patient sent the following correspondence through [place]. Routing to triage to assist patient with request.  Notes--how I feel since estradiol & progesterone:   *Overall I feel much better than I did in January & February.   *The progesterone helped w/ sleep and anxiety.   *The 4th day after not taking progesterone, I woke up w/a little anxiety.   *2 days before starting 2nd cycle of progesterone, experienced insomnia & woke middle of the night w/anxiety.   *Since I began taking the progesterone on a Tuesday (3/3), I wanted to wait until Tuesday (3/31) to start the 2nd cycle. However, tired of not sleeping through the night, I took it Monday night and I woke up at 3:50am after going to bed 1. Since COVID-19 & working from home, I can get up later for work so I was hoping if I went to sleep at 1, I would sleep all night.   *Not sure if the progesterone takes time to build up each time. Would be nice to sleep all night.   *I don't sleep like I'd like, & sometimes I wake during the night w/anxiety, but I don't wake w/nervousness in the pit of my stomach.    Consider Escitalopram later?    Thank you!

## 2018-10-06 NOTE — Telephone Encounter (Signed)
She can try the lexapro if desired. This should help with anxiety. If the patient wants to try this, please call in lexapro 10 mg, take 1/2 tablet a day for one week, if tolerating then increase to 1 tablet a day. #30, 1 refill. F/U with Dr Sabra Heck in one month.

## 2018-10-06 NOTE — Telephone Encounter (Signed)
Routing to Dr. Talbert Nan for review and advise. Patient last seen with Dr.Miller on 09/07/2018 and was started on Prometrium 200 mg nightly days 1-14 each month at that time. Per Dr.Miller's note would wait and see how the patient did after 1 month and possibly consider adding Lexapro 10 mg daily at that time.

## 2018-10-07 NOTE — Telephone Encounter (Signed)
Spoke with patient, advised as seen below per Dr. Talbert Nan. Patient does not need Rx, RX sent on 09/07/18 for lexapro 10 mg tab #30/1RF, has not filled, on file at pharmacy. Patient declines to schedule 48mo f/u at this time, will start medication and "she how she does", will return call to schedule f/u if she decide to continue medication.   Routing to provider for final review. Patient is agreeable to disposition. Will close encounter.

## 2018-10-29 ENCOUNTER — Other Ambulatory Visit: Payer: Self-pay | Admitting: Obstetrics & Gynecology

## 2018-10-29 NOTE — Telephone Encounter (Signed)
Medication refill request: lexapro Last AEX:  09-07-2018 Next AEX: 09-15-2019 Last MMG (if hormonal medication request): n/a Refill authorized: pharmacy requesting 90 day supply. Please approve if appropriate

## 2018-12-27 ENCOUNTER — Other Ambulatory Visit: Payer: Self-pay | Admitting: Obstetrics & Gynecology

## 2018-12-28 NOTE — Telephone Encounter (Signed)
Medication refill request: Progesterone 200 mg take 1 tablet daily for 14 days each month Last AEX:  09/07/2018 Next AEX: 09/15/2019 Last MMG (if hormonal medication request): 07/06/2018 Birads 3 probably benign, has left breast diagnostic mmg and ultrasound scheduled for 01/13/2019 Refill authorized: Progesterone 200 mg #14 0RF pending  Please refill if appropriate

## 2018-12-31 ENCOUNTER — Encounter: Payer: Self-pay | Admitting: Obstetrics & Gynecology

## 2018-12-31 ENCOUNTER — Telehealth: Payer: Self-pay | Admitting: Obstetrics & Gynecology

## 2018-12-31 ENCOUNTER — Encounter: Payer: Self-pay | Admitting: Podiatry

## 2018-12-31 NOTE — Telephone Encounter (Signed)
Called patient and left message for her to return call to [place].

## 2018-12-31 NOTE — Telephone Encounter (Signed)
Patient sent the following message through [place]. Routing to triage to assist patient with request.  Hi Dr. Sabra Heck.  I hope you and yours are safe well. Thank you so much for recommending and prescribing the Lexapro. I haven't slept this well in years!!! I went to refill the progesterone the other day and [facility] stated they had to fax your office. If I don't need it, I'm fine with not having it. I'm still learning how estrogen and progesterone go together, so I'll go with whatever you think.    I was telling my sister about you and she had an appointment, but there was some type of mix-up in getting her in. I really hate that because she waited for months and this happened. I hope she can get in soon, because I really think she could benefit from seeing you.    Take good care.  Erin [name]

## 2018-12-31 NOTE — Telephone Encounter (Signed)
Spoke with patient. She wanted to let Dr.Miller know she is doing great and sleeping well since adding Lexapro! Patient wasn't sure if she needed to continue Progesterone. Advised very important to take Progesterone along with Estrogen--Vivelle Dot. Explained to patient this helps protect lining of uterus. Patient voiced understanding. Routed to provider

## 2019-01-13 ENCOUNTER — Ambulatory Visit
Admission: RE | Admit: 2019-01-13 | Discharge: 2019-01-13 | Disposition: A | Discharge status: Home / Self Care | Payer: BC Managed Care – PPO | Source: Ambulatory Visit | Attending: Obstetrics & Gynecology | Admitting: Obstetrics & Gynecology

## 2019-01-13 DIAGNOSIS — N63 Unspecified lump in unspecified breast: Secondary | ICD-10-CM

## 2019-01-25 ENCOUNTER — Other Ambulatory Visit: Payer: Self-pay | Admitting: Obstetrics & Gynecology

## 2019-01-25 NOTE — Telephone Encounter (Signed)
Medication refill request: Lexapro  Last AEX:  09-07-2018 SM  Next AEX: 09-15-19 Last MMG (if hormonal medication request): n/a Refill authorized: Today, please advise.   Medication pended for #90, 0RF. Please refill if appropriate.

## 2019-03-24 ENCOUNTER — Other Ambulatory Visit: Payer: Self-pay | Admitting: Obstetrics & Gynecology

## 2019-03-24 NOTE — Telephone Encounter (Signed)
Medication refill request: Progesterone 200mg  #14,6R Last AEX:  09-07-18 Next AEX: 09-15-19 Last MMG (if hormonal medication request): 01-13-19 Lt.Br.w/asymmetry, Rt.Br.neg/densityC--Lt.Diag.& [place] probably benign mass. 6 month f/u Lt.Br.Diag.&US--mass compatible with cyst/density C/BiRads2--screening 06/2019 Refill authorized: Please refill if appropriate

## 2019-06-06 ENCOUNTER — Other Ambulatory Visit: Payer: Self-pay | Admitting: *Deleted

## 2019-06-06 NOTE — Telephone Encounter (Signed)
Medication refill request: Estradiol  Last AEX:  09-07-2018 SM  Next AEX: 09-15-19  Last MMG (if hormonal medication request): 01-13-2019 density C/BIRADS 2 negative  Refill authorized: Today, please advise.   Pharmacy sent refill request asking for prescription to be sent as 90 supply. Medication pended for #24, 0RF. Please refill if appropriate.

## 2019-06-07 MED ORDER — ESTRADIOL 0.075 MG/24HR TD PTTW: 1.0000 | MEDICATED_PATCH | TRANSDERMAL | 0 refills | Status: DC

## 2019-06-15 ENCOUNTER — Other Ambulatory Visit: Payer: Self-pay | Admitting: Obstetrics & Gynecology

## 2019-06-15 DIAGNOSIS — Z1231 Encounter for screening mammogram for malignant neoplasm of breast: Secondary | ICD-10-CM

## 2019-08-03 ENCOUNTER — Ambulatory Visit
Admission: RE | Admit: 2019-08-03 | Discharge: 2019-08-03 | Disposition: A | Discharge status: Home / Self Care | Payer: BC Managed Care – PPO | Source: Ambulatory Visit | Attending: Obstetrics & Gynecology | Admitting: Obstetrics & Gynecology

## 2019-08-03 ENCOUNTER — Other Ambulatory Visit: Payer: Self-pay

## 2019-08-03 DIAGNOSIS — Z1231 Encounter for screening mammogram for malignant neoplasm of breast: Secondary | ICD-10-CM

## 2019-09-14 ENCOUNTER — Other Ambulatory Visit: Payer: Self-pay

## 2019-09-15 ENCOUNTER — Encounter: Payer: Self-pay | Admitting: Obstetrics & Gynecology

## 2019-09-15 ENCOUNTER — Other Ambulatory Visit (HOSPITAL_COMMUNITY)
Admission: RE | Admit: 2019-09-15 | Discharge: 2019-09-15 | Disposition: A | Discharge status: Home / Self Care | Payer: BC Managed Care – PPO | Source: Ambulatory Visit | Attending: Obstetrics & Gynecology | Admitting: Obstetrics & Gynecology

## 2019-09-15 ENCOUNTER — Ambulatory Visit: Payer: BC Managed Care – PPO | Admitting: Obstetrics & Gynecology

## 2019-09-15 VITALS — BP 128/80 | HR 100 | Temp 97.0°F | Resp 10 | Ht 64.0 in | Wt 157.0 lb

## 2019-09-15 DIAGNOSIS — Z01419 Encounter for gynecological examination (general) (routine) without abnormal findings: Secondary | ICD-10-CM | POA: Diagnosis not present

## 2019-09-15 DIAGNOSIS — Z79890 Hormone replacement therapy (postmenopausal): Secondary | ICD-10-CM | POA: Diagnosis not present

## 2019-09-15 DIAGNOSIS — Z124 Encounter for screening for malignant neoplasm of cervix: Secondary | ICD-10-CM

## 2019-09-15 MED ORDER — ZOLPIDEM TARTRATE 5 MG PO TABS: ORAL_TABLET | ORAL | 0 refills | Status: DC

## 2019-09-15 NOTE — Progress Notes (Signed)
54 y.o. G1P0 Single Black or [place] American female here for annual exam.  Has been taking BP at home.  These have been normal.  BP was normal here today as well.  She is cycling but she does not always have a month.  She thinks it is after finishing the progesterone.  Bleeding lasts 4-5 days.    Considering getting Covid.  Will be working at the [employer] site at [employer].    LMP:  08/28/2019          Sexually active: No.  The current method of family planning is post menopausal status.    Exercising: Yes.    walking Smoker:  no  Health Maintenance: Pap:   09/07/18 Neg:Neg HR HPV  09/02/17 ASC-H. HR HPV:neg              08/28/16 LSIL. HR HPV: +detected. Colpo: CIN I History of abnormal Pap:  yes MMG:  08/03/19 BIRADS 1 negative/density c Colonoscopy:  Cologuard done 08/2018.  Negative.   BMD:   Never TDaP:  09/07/2018 Pneumonia vaccine(s):  n/a Shingrix:   no Hep C testing: n/a Screening Labs: discuss today   reports that she has never smoked. She has never used smokeless tobacco. She reports current alcohol use. She reports that she does not use drugs.  Past Medical History:  Diagnosis Date  . Anxiety   . Gonorrhea 1988  . Headache    menstrual  . Sickle cell trait ([place])       . White coat syndrome without diagnosis of hypertension 09/07/2018    Past Surgical History:  Procedure Laterality Date  . ABLATION SAPHENOUS VEIN W/ RFA     Right leg  . BREAST BIOPSY  06/10/2011   Procedure: BREAST BIOPSY WITH NEEDLE LOCALIZATION;  Surgeon: Imogene Burn. Georgette Dover, MD;  Location: [facility];  Service: General;  Laterality: Right;  Right needle localized lumpectomy  . BREAST BIOPSY Right 05/2011  . BREAST EXCISIONAL BIOPSY Right 06/10/2011  . MYOMECTOMY N/A 06/27/2013   Procedure: VAGINAL MYOMECTOMY;  Surgeon: Lyman Speller, MD;  Location: [facility];  Service: Gynecology;  Laterality: N/A;  . TOE SURGERY  7/09   broken toe  . WISDOM TOOTH EXTRACTION      Current Outpatient Medications   Medication Sig Dispense Refill  . escitalopram (LEXAPRO) 10 MG tablet TAKE 1 TABLET BY MOUTH EVERY DAY 90 tablet 2  . estradiol (VIVELLE-DOT) 0.075 MG/24HR Place 1 patch onto the skin 2 (two) times a week. 24 patch 0  . fish oil-omega-3 fatty acids 1000 MG capsule Take 1 g by mouth daily.      . Multiple Vitamin (MULTIVITAMIN) tablet Take 1 tablet by mouth daily.      . NON FORMULARY Testosterone pellet 137.5 mg    . progesterone (PROMETRIUM) 200 MG capsule TAKE ONE CAPSULE NIGHTLY FOR 14 DAYS EACH MONTH. 14 capsule 6  . zolpidem (AMBIEN) 5 MG tablet 1-2 tablets qhs prn insomnia (Patient not taking: Reported on 09/15/2019) 30 tablet 0   No current facility-administered medications for this visit.    Family History  Problem Relation Age of Onset  . Hypertension Mother   . Anxiety disorder Mother   . Tuberculosis Father        ?/not sure  . Heart disease Maternal Grandmother   . Hypertension Sister     Review of Systems  Psychiatric/Behavioral: The patient is nervous/anxious.   All other systems reviewed and are negative.   Exam:   BP 128/80 (  BP Location: Right Arm, Patient Position: Sitting, Cuff Size: Normal)   Pulse 100   Temp (!) 97 F (36.1 C) (Temporal)   Resp 10   Ht 5\' 4"  (1.626 m)   Wt 157 lb (71.2 kg)   LMP 08/29/2018   BMI 26.95 kg/m     Height: 5\' 4"  (162.6 cm)  Ht Readings from Last 3 Encounters:  09/15/19 5\' 4"  (1.626 m)  09/07/18 5' 4.5" (1.638 m)  08/09/18 5' 4.5" (1.638 m)    General appearance: alert, cooperative and appears stated age Head: Normocephalic, without obvious abnormality, atraumatic Neck: no adenopathy, supple, symmetrical, trachea midline and thyroid normal to inspection and palpation Lungs: clear to auscultation bilaterally Breasts: normal appearance, no masses or tenderness Heart: regular rate and rhythm Abdomen: soft, non-tender; bowel sounds normal; no masses,  no organomegaly Extremities: extremities normal, atraumatic, no  cyanosis or edema Skin: Skin color, texture, turgor normal. No rashes or lesions Lymph nodes: Cervical, supraclavicular, and axillary nodes normal. No abnormal inguinal nodes palpated Neurologic: Grossly normal   Pelvic: External genitalia:  no lesions              Urethra:  normal appearing urethra with no masses, tenderness or lesions              Bartholins and Skenes: normal                 Vagina: normal appearing vagina with normal color and discharge, no lesions              Cervix: no lesions              Pap taken: Yes.   Bimanual Exam:  Uterus:  normal size, contour, position, consistency, mobility, non-tender              Adnexa: normal adnexa and no mass, fullness, tenderness               Rectovaginal: Confirms               Anus:  normal sphincter tone, no lesions  Chaperone, Terence Lux, CMA, was present for exam.  A:  Well Woman with normal exam PMP, on HRT H/o ASCUS H pap with neg HR HPV 2/19 Anxiety Mild insomnia Not SA H/o left intraductal papilloma s/p excision 12/12 White coat hypertension  P:   Mammogram guidelines reviewed pap smear with HR HPV HPV obtained today Estradiol level obtained today.  Will wait until get estradiol level back before writing HRT prescriptions shingrix vaccination discussed Lab work for screening done 2020.  None obtained today. return annually or prn

## 2019-09-16 ENCOUNTER — Encounter: Payer: Self-pay | Admitting: Obstetrics & Gynecology

## 2019-09-16 LAB — ESTRADIOL: Estradiol: 78 pg/mL

## 2019-09-17 ENCOUNTER — Other Ambulatory Visit: Payer: Self-pay | Admitting: Obstetrics & Gynecology

## 2019-09-18 ENCOUNTER — Other Ambulatory Visit: Payer: Self-pay | Admitting: Obstetrics & Gynecology

## 2019-09-18 ENCOUNTER — Encounter: Payer: Self-pay | Admitting: Obstetrics & Gynecology

## 2019-09-18 MED ORDER — ESTRADIOL 0.075 MG/24HR TD PTTW: 1.0000 | MEDICATED_PATCH | TRANSDERMAL | 4 refills | Status: DC

## 2019-09-18 MED ORDER — ESCITALOPRAM OXALATE 10 MG PO TABS: 10.0000 mg | ORAL_TABLET | Freq: Every day | ORAL | 4 refills | Status: DC

## 2019-09-18 MED ORDER — PROGESTERONE MICRONIZED 200 MG PO CAPS: ORAL_CAPSULE | ORAL | 12 refills | Status: DC

## 2019-09-19 LAB — CYTOLOGY - PAP
Comment: NEGATIVE
Diagnosis: NEGATIVE
High risk HPV: NEGATIVE

## 2019-12-29 ENCOUNTER — Telehealth: Payer: Self-pay | Admitting: Podiatry

## 2019-12-29 NOTE — Telephone Encounter (Signed)
Pt left message for me to call to discuss ordering another pair of orthotics. The last ones she got was 2019.   I returned call and explained she needed an appt if we are going to [employer] and she has [place] state and they are covered @ 80% after deductible(pt stated she has 80/20plan) and the cost is 438.00. I also gave her the option to refurbish the ones she has for 90.00. She said thanks for the information and will think about it.

## 2020-01-27 ENCOUNTER — Ambulatory Visit: Payer: BC Managed Care – PPO | Admitting: Obstetrics & Gynecology

## 2020-02-18 ENCOUNTER — Encounter: Payer: Self-pay | Admitting: Obstetrics & Gynecology

## 2020-04-10 ENCOUNTER — Encounter: Payer: Self-pay | Admitting: Obstetrics & Gynecology

## 2020-06-02 ENCOUNTER — Encounter: Payer: Self-pay | Admitting: Obstetrics & Gynecology

## 2020-07-03 ENCOUNTER — Other Ambulatory Visit: Payer: Self-pay | Admitting: Obstetrics & Gynecology

## 2020-07-03 DIAGNOSIS — Z1231 Encounter for screening mammogram for malignant neoplasm of breast: Secondary | ICD-10-CM

## 2020-08-10 ENCOUNTER — Ambulatory Visit
Admission: RE | Admit: 2020-08-10 | Discharge: 2020-08-10 | Disposition: A | Discharge status: Home / Self Care | Payer: BC Managed Care – PPO | Source: Ambulatory Visit | Attending: Obstetrics & Gynecology | Admitting: Obstetrics & Gynecology

## 2020-08-10 ENCOUNTER — Other Ambulatory Visit: Payer: Self-pay

## 2020-08-10 DIAGNOSIS — Z1231 Encounter for screening mammogram for malignant neoplasm of breast: Secondary | ICD-10-CM

## 2020-09-17 ENCOUNTER — Encounter (HOSPITAL_BASED_OUTPATIENT_CLINIC_OR_DEPARTMENT_OTHER): Payer: Self-pay

## 2020-09-28 ENCOUNTER — Other Ambulatory Visit (HOSPITAL_COMMUNITY)
Admission: RE | Admit: 2020-09-28 | Discharge: 2020-09-28 | Disposition: A | Discharge status: Home / Self Care | Payer: BC Managed Care – PPO | Source: Ambulatory Visit | Attending: Obstetrics & Gynecology | Admitting: Obstetrics & Gynecology

## 2020-09-28 ENCOUNTER — Encounter (HOSPITAL_BASED_OUTPATIENT_CLINIC_OR_DEPARTMENT_OTHER): Payer: Self-pay | Admitting: Obstetrics & Gynecology

## 2020-09-28 ENCOUNTER — Ambulatory Visit (INDEPENDENT_AMBULATORY_CARE_PROVIDER_SITE_OTHER): Payer: BC Managed Care – PPO | Admitting: Obstetrics & Gynecology

## 2020-09-28 ENCOUNTER — Other Ambulatory Visit: Payer: Self-pay

## 2020-09-28 VITALS — BP 132/100 | HR 91 | Ht 64.25 in | Wt 172.0 lb

## 2020-09-28 DIAGNOSIS — F419 Anxiety disorder, unspecified: Secondary | ICD-10-CM | POA: Diagnosis not present

## 2020-09-28 DIAGNOSIS — Z124 Encounter for screening for malignant neoplasm of cervix: Secondary | ICD-10-CM | POA: Insufficient documentation

## 2020-09-28 DIAGNOSIS — D219 Benign neoplasm of connective and other soft tissue, unspecified: Secondary | ICD-10-CM

## 2020-09-28 DIAGNOSIS — F32A Depression, unspecified: Secondary | ICD-10-CM

## 2020-09-28 DIAGNOSIS — Z01419 Encounter for gynecological examination (general) (routine) without abnormal findings: Secondary | ICD-10-CM

## 2020-09-28 DIAGNOSIS — D241 Benign neoplasm of right breast: Secondary | ICD-10-CM

## 2020-09-28 DIAGNOSIS — Z79890 Hormone replacement therapy (postmenopausal): Secondary | ICD-10-CM | POA: Diagnosis not present

## 2020-09-28 DIAGNOSIS — N870 Mild cervical dysplasia: Secondary | ICD-10-CM

## 2020-09-28 MED ORDER — ESCITALOPRAM OXALATE 10 MG PO TABS: 20.0000 mg | ORAL_TABLET | Freq: Every day | ORAL | 4 refills | Status: DC

## 2020-09-28 MED ORDER — ESTRADIOL 0.075 MG/24HR TD PTTW: 1.0000 | MEDICATED_PATCH | TRANSDERMAL | 4 refills | Status: DC

## 2020-09-28 MED ORDER — PROGESTERONE 200 MG PO CAPS: ORAL_CAPSULE | ORAL | 4 refills | Status: DC

## 2020-09-28 NOTE — Progress Notes (Signed)
55 y.o. G1P0 Single Black or [place] American female here for annual exam.  Still having menstrual cycles.  Flow is light at first and then more normal and lasts 6 days all total.  Still having monthly cycles.  She is on HRT and bleeds after the progesterone withdrawal.    Has new job at [employer] on main campus at the [place].  Only has to be on campus 2 days a week.  Rest of time she works from home.  Patient's last menstrual period was 08/29/2018.          Sexually active: No.  The current method of family planning is post menopausal status.    Exercising: Yes.     Smoker:  no  Health Maintenance: Pap:     09/15/2019:  Neg with neg HR HPV  09/07/18 Neg:Neg HR HPV             09/02/17 ASC-H. HR HPV:neg  08/28/16 LSIL. HR HPV: +detected. Colpo: CIN I History of abnormal Pap:  yes MMG:  08/2020 Colonoscopy: 2020 cologuard negative.  Repeat in one year. BMD:   Not indicated TDaP:  09/2018 Pneumonia vaccine(s):  n/a Shingrix:   completed Hep C testing: will do with next blood work Screening Labs: plan 1-2 years   reports that she has never smoked. She has never used smokeless tobacco. She reports current alcohol use. She reports that she does not use drugs.  Past Medical History:  Diagnosis Date  . Anxiety   . Gonorrhea 1988  . Headache    menstrual  . Sickle cell trait ([place])       . Vaginal Pap smear, abnormal   . White coat syndrome without diagnosis of hypertension 09/07/2018    Past Surgical History:  Procedure Laterality Date  . ABLATION SAPHENOUS VEIN W/ RFA     Right leg  . BREAST BIOPSY  06/10/2011   Procedure: BREAST BIOPSY WITH NEEDLE LOCALIZATION;  Surgeon: Imogene Burn. Georgette Dover, MD;  Location: [facility];  Service: General;  Laterality: Right;  Right needle localized lumpectomy  . BREAST BIOPSY Right 05/2011  . BREAST EXCISIONAL BIOPSY Right 06/10/2011  . MYOMECTOMY N/A 06/27/2013   Procedure: VAGINAL MYOMECTOMY;  Surgeon: Lyman Speller, MD;  Location: [facility];  Service: Gynecology;  Laterality: N/A;  . TOE SURGERY  7/09   broken toe  . WISDOM TOOTH EXTRACTION      Current Outpatient Medications  Medication Sig Dispense Refill  . escitalopram (LEXAPRO) 10 MG tablet Take 1 tablet (10 mg total) by mouth daily. 90 tablet 4  . estradiol (VIVELLE-DOT) 0.075 MG/24HR Place 1 patch onto the skin 2 (two) times a week. 24 patch 4  . fish oil-omega-3 fatty acids 1000 MG capsule Take 1 g by mouth daily.    . Multiple Vitamin (MULTIVITAMIN) tablet Take 1 tablet by mouth daily.    . NON FORMULARY Testosterone pellet 137.5 mg    . progesterone (PROMETRIUM) 200 MG capsule 1 capsule days 1-14 each month 14 capsule 12   No current facility-administered medications for this visit.    Family History  Problem Relation Age of Onset  . Hypertension Mother   . Anxiety disorder Mother   . Tuberculosis Father        ?/not sure  . Heart disease Maternal Grandmother   . Hypertension Sister     Review of Systems  Endocrine: Polydipsia:    All other systems reviewed and are negative.   Exam:   BP Marland Kitchen)  132/100 (BP Location: Left Arm, Patient Position: Sitting, Cuff Size: Normal)   Pulse 91   Ht 5' 4.25" (1.632 m)   Wt 172 lb (78 kg)   LMP 08/29/2018   BMI 29.29 kg/m   Height: 5' 4.25" (163.2 cm)  General appearance: alert, cooperative and appears stated age Head: Normocephalic, without obvious abnormality, atraumatic Neck: no adenopathy, supple, symmetrical, trachea midline and thyroid normal to inspection and palpation Lungs: clear to auscultation bilaterally Breasts: normal appearance, no masses or tenderness Heart: regular rate and rhythm Abdomen: soft, non-tender; bowel sounds normal; no masses,  no organomegaly Extremities: extremities normal, atraumatic, no cyanosis or edema Skin: Skin color, texture, turgor normal. No rashes or lesions Lymph nodes: Cervical, supraclavicular, and axillary nodes normal. No abnormal inguinal nodes  palpated Neurologic: Grossly normal   Pelvic: External genitalia:  no lesions              Urethra:  normal appearing urethra with no masses, tenderness or lesions              Bartholins and Skenes: normal                 Vagina: normal appearing vagina with normal color and discharge, no lesions              Cervix: no lesions              Pap taken: Yes.   Bimanual Exam:  Uterus:  normal size, contour, position, consistency, mobility, non-tender              Adnexa: normal adnexa and no mass, fullness, tenderness               Rectovaginal: Confirms               Anus:  normal sphincter tone, no lesions  Chaperone, Cydney Ok, CMA, was present for exam.  Assessment/Plan: 1. Well woman exam with routine gynecological exam - pap only obtained today.  H/O abnormal pap smears as per above with follow up normal paps/neg HR HPV x 3.  Pt aware should repeat HR HPV based testing in 3 years.  She desires pap today. - MMG 08/2020 - cologuard neg 2020.  Aware to repeat next year - plan blood work next year - vaccines UTD  2.  Hormone replacement therapy (HRT) - estradiol (VIVELLE-DOT) 0.075 MG/24HR; Place 1 patch onto the skin 2 (two) times a week.  Dispense: 24 patch; Refill: 4 - progesterone (PROMETRIUM) 200 MG capsule; Take 1 capsule daily days 1-14 each month  Dispense: 45 capsule; Refill: 4  3. Anxiety and depression  4. Fibroid  6. Intraductal papilloma of right breast, s/p excision 2012

## 2020-10-02 LAB — CYTOLOGY - PAP
Adequacy: ABSENT
Diagnosis: NEGATIVE

## 2020-10-05 ENCOUNTER — Encounter (HOSPITAL_BASED_OUTPATIENT_CLINIC_OR_DEPARTMENT_OTHER): Payer: Self-pay

## 2020-11-25 ENCOUNTER — Encounter (HOSPITAL_BASED_OUTPATIENT_CLINIC_OR_DEPARTMENT_OTHER): Payer: Self-pay

## 2020-12-14 ENCOUNTER — Ambulatory Visit: Payer: BC Managed Care – PPO

## 2021-03-26 ENCOUNTER — Encounter (HOSPITAL_BASED_OUTPATIENT_CLINIC_OR_DEPARTMENT_OTHER): Payer: Self-pay

## 2021-06-27 ENCOUNTER — Other Ambulatory Visit: Payer: Self-pay

## 2021-06-27 ENCOUNTER — Ambulatory Visit: Payer: BC Managed Care – PPO

## 2021-06-27 ENCOUNTER — Ambulatory Visit: Payer: BC Managed Care – PPO | Admitting: Podiatry

## 2021-06-27 DIAGNOSIS — M2142 Flat foot [pes planus] (acquired), left foot: Secondary | ICD-10-CM

## 2021-06-27 DIAGNOSIS — M2141 Flat foot [pes planus] (acquired), right foot: Secondary | ICD-10-CM

## 2021-06-27 NOTE — Progress Notes (Signed)
SITUATION Reason for Consult: Evaluation for Bilateral Custom Foot Orthoses Patient / Caregiver Report: Patient had orthotics previously and would like new ones built the same  OBJECTIVE DATA: Patient History / Diagnosis:    ICD-10-CM   1. Flat foot (pes planus) (acquired), left foot  M21.42     2. Flat foot (pes planus) (acquired), right foot  M21.41       Current or Previous Devices: custom foot orthotics from [facility] Examination: Skin presentation:   Intact Ulcers & Callousing:   None and no history Toe / Foot Deformities:  Pes planus Weight Bearing Presentation:  planus Sensation:    Intact  ORTHOTIC RECOMMENDATION Recommended Device: 1x pair of custom functional foot orthotics  GOALS OF ORTHOSES - Reduce Pain - Prevent Foot Deformity - Prevent Progression of Further Foot Deformity - Relieve Pressure - Improve the Overall Biomechanical Function of the Foot and Lower Extremity.  ACTIONS PERFORMED Patient was casted for Foot Orthoses via crush box. Procedure was explained and patient tolerated procedure well. All questions were answered and concerns addressed.  PLAN Potential out of pocket cost was communicated to patient. Casts are to be sent to [facility] for fabrication. Patient is to be called for fitting when devices are ready.

## 2021-06-27 NOTE — Progress Notes (Signed)
Subjective:  °Patient ID: Erin [name], female    DOB: 11/04/1965,  MRN: [id number] ° °Chief Complaint  °Patient presents with  ° Foot Orthotics  ° ° °55 y.o. female presents with the above complaint. History confirmed with patient.  She presents reevaluation of her bilateral flatfoot.  She has had this for some time.  Is becoming increasingly painful again.  She did have orthotics made 3 years ago and these were doing well until recently.  She is here for new orthotics. ° °Objective:  °Physical Exam: °warm, good capillary refill, no trophic changes or ulcerative lesions, normal DP and PT pulses, normal sensory exam, and flexible pes planus deformity, no evidence of coalition, good strength 5 out of 5 in all planes. °Assessment:  ° °1. Pes planus of both feet   ° ° ° °Plan:  °Patient was evaluated and treated and all questions answered. ° °Discussed the etiology, pathomechanics and treatment options of pes planus deformity in detail. We discussed how pes planus deformity without pain or functional limitation is quite common and often does not require any treatment.  However when pain or functional limitation arises, treatment with nonsurgical therapy is our first line with stretching, physical therapy, and supportive orthoses.  She has done well with orthoses in the past.  She was casted for new orthotics today.  She met with the orthotist for this.  We also discussed surgical reduction if this did not improve or worsen at any point.  She will return to see me as needed. ° °No follow-ups on file.  ° °

## 2021-07-10 ENCOUNTER — Other Ambulatory Visit: Payer: Self-pay | Admitting: Obstetrics & Gynecology

## 2021-07-10 DIAGNOSIS — Z1231 Encounter for screening mammogram for malignant neoplasm of breast: Secondary | ICD-10-CM

## 2021-07-15 ENCOUNTER — Encounter (HOSPITAL_BASED_OUTPATIENT_CLINIC_OR_DEPARTMENT_OTHER): Payer: Self-pay | Admitting: Obstetrics & Gynecology

## 2021-07-25 ENCOUNTER — Encounter (HOSPITAL_BASED_OUTPATIENT_CLINIC_OR_DEPARTMENT_OTHER): Payer: Self-pay | Admitting: Obstetrics & Gynecology

## 2021-07-25 ENCOUNTER — Ambulatory Visit: Payer: BC Managed Care – PPO

## 2021-07-25 ENCOUNTER — Other Ambulatory Visit: Payer: Self-pay

## 2021-07-25 ENCOUNTER — Telehealth (INDEPENDENT_AMBULATORY_CARE_PROVIDER_SITE_OTHER): Payer: BC Managed Care – PPO | Admitting: Obstetrics & Gynecology

## 2021-07-25 DIAGNOSIS — F32A Depression, unspecified: Secondary | ICD-10-CM

## 2021-07-25 DIAGNOSIS — Z9229 Personal history of other drug therapy: Secondary | ICD-10-CM | POA: Insufficient documentation

## 2021-07-25 DIAGNOSIS — R5383 Other fatigue: Secondary | ICD-10-CM

## 2021-07-25 DIAGNOSIS — M2141 Flat foot [pes planus] (acquired), right foot: Secondary | ICD-10-CM

## 2021-07-25 NOTE — Progress Notes (Signed)
SITUATION: Reason for Visit: Fitting and Delivery of Custom Fabricated Foot Orthoses Patient Report: Patient reports comfort and is satisfied with device.  OBJECTIVE DATA: Patient History / Diagnosis:     ICD-10-CM   1. Pes planus of both feet  M21.41    M21.42       Provided Device:  Custom Functional Foot Orthotics  GOAL OF ORTHOSIS - Improve gait - Decrease energy expenditure - Improve Balance - Provide Triplanar stability of foot complex - Facilitate motion  ACTIONS PERFORMED Patient was fit with foot orthotics trimmed to shoe last. Patient tolerated fittign procedure.   Patient was provided with verbal and written instruction and demonstration regarding donning, doffing, wear, care, proper fit, function, purpose, cleaning, and use of the orthosis and in all related precautions and risks and benefits regarding the orthosis.  Patient was also provided with verbal instruction regarding how to report any failures or malfunctions of the orthosis and necessary follow up care. Patient was also instructed to contact our office regarding any change in status that may affect the function of the orthosis.  Patient demonstrated independence with proper donning, doffing, and fit and verbalized understanding of all instructions.  PLAN: Patient is to follow up in one week or as necessary (PRN). All questions were answered and concerns addressed. Plan of care was discussed with and agreed upon by the patient.

## 2021-07-25 NOTE — Progress Notes (Signed)
Virtual Visit via Video Note  I connected with Erin [name] on 07/25/21 at  8:15 AM EST by a video enabled telemedicine application and verified that I am speaking with the correct person using two identifiers.  Location: Patient: home Provider: office   I discussed the limitations of evaluation and management by telemedicine and the availability of in person appointments. The patient expressed understanding and agreed to proceed.  History of Present Illness: 56 yo G1P0 SAAF for virtual visit for complaints of feeling less motivation.  Reports, for exam, she loves line dancing and hasn't done this for over a year.  She is working at [employer] and commutes two days a week.  The rest of the time she works from home.  Is on HRT and still cycles after progesterone each month.  Received testosterone pellet placement from [facility].  Having this every 5 months.  Initially, she was maintaining her testosterone level but last two labs that she has shared with me show the testosterone level is around 12 when she received the next pellet.  D/w pt this may be part of her symptoms as this is dropping.  She does feel more energy after having a pellet placed.  She also has thyroid tested at least yearly or every other year with [facility].  Last one I have was from 09/2020 (reviewed with pt) and it was normal.  She is pretty sure she had another one done in the fall.  Is scheduled in April.  Depression screen PHQ 2/9 07/25/2021  Decreased Interest 2  Down, Depressed, Hopeless 1  PHQ - 2 Score 3  Altered sleeping 1  Tired, decreased energy 0  Change in appetite 0  Feeling bad or failure about yourself  0  Trouble concentrating 1  Moving slowly or fidgety/restless 0  Suicidal thoughts 1  PHQ-9 Score 6  Difficult doing work/chores Somewhat difficult   Results discussed.  She is on Lexapro 20mg   Discussed inching this up but will wait to see how she feels after having testosterone pellet placement    Observations/Objective: WNWD Female, NAD  Assessment and Plan: 1. Low energy  2. History of postmenopausal HRT - will continue current HRT but follow up with [facility] for testosterone pellet placement  3. Mild depression - continue lexapro 20mg  daily.  No new rx needed for now   Follow Up Instructions: I discussed the assessment and treatment plan with the patient. The patient was provided an opportunity to ask questions and all were answered. The patient agreed with the plan and demonstrated an understanding of the instructions.   The patient was advised to call back or seek an in-person evaluation if the symptoms worsen or if the condition fails to improve as anticipated.  I provided 21 minutes of non-face-to-face time during this encounter.   Megan Salon, MD

## 2021-08-12 ENCOUNTER — Ambulatory Visit
Admission: RE | Admit: 2021-08-12 | Discharge: 2021-08-12 | Disposition: A | Discharge status: Home / Self Care | Payer: BC Managed Care – PPO | Source: Ambulatory Visit

## 2021-08-12 DIAGNOSIS — Z1231 Encounter for screening mammogram for malignant neoplasm of breast: Secondary | ICD-10-CM

## 2021-08-27 ENCOUNTER — Encounter (HOSPITAL_BASED_OUTPATIENT_CLINIC_OR_DEPARTMENT_OTHER): Payer: Self-pay | Admitting: Obstetrics & Gynecology

## 2021-10-03 ENCOUNTER — Other Ambulatory Visit (HOSPITAL_COMMUNITY)
Admission: RE | Admit: 2021-10-03 | Discharge: 2021-10-03 | Disposition: A | Discharge status: Home / Self Care | Payer: BC Managed Care – PPO | Source: Ambulatory Visit | Attending: Obstetrics & Gynecology | Admitting: Obstetrics & Gynecology

## 2021-10-03 ENCOUNTER — Other Ambulatory Visit: Payer: Self-pay

## 2021-10-03 ENCOUNTER — Ambulatory Visit (INDEPENDENT_AMBULATORY_CARE_PROVIDER_SITE_OTHER): Payer: BC Managed Care – PPO | Admitting: Obstetrics & Gynecology

## 2021-10-03 ENCOUNTER — Encounter (HOSPITAL_BASED_OUTPATIENT_CLINIC_OR_DEPARTMENT_OTHER): Payer: Self-pay | Admitting: Obstetrics & Gynecology

## 2021-10-03 VITALS — BP 144/90 | HR 85 | Ht 64.25 in | Wt 177.2 lb

## 2021-10-03 DIAGNOSIS — Z124 Encounter for screening for malignant neoplasm of cervix: Secondary | ICD-10-CM | POA: Diagnosis present

## 2021-10-03 DIAGNOSIS — Z1211 Encounter for screening for malignant neoplasm of colon: Secondary | ICD-10-CM

## 2021-10-03 DIAGNOSIS — Z9229 Personal history of other drug therapy: Secondary | ICD-10-CM

## 2021-10-03 DIAGNOSIS — L680 Hirsutism: Secondary | ICD-10-CM | POA: Diagnosis not present

## 2021-10-03 DIAGNOSIS — Z01419 Encounter for gynecological examination (general) (routine) without abnormal findings: Secondary | ICD-10-CM | POA: Diagnosis not present

## 2021-10-03 DIAGNOSIS — Z79890 Hormone replacement therapy (postmenopausal): Secondary | ICD-10-CM

## 2021-10-03 DIAGNOSIS — Z0000 Encounter for general adult medical examination without abnormal findings: Secondary | ICD-10-CM

## 2021-10-03 MED ORDER — EFLORNITHINE HCL 13.9 % EX CREA: 1.0000 "application " | TOPICAL_CREAM | Freq: Two times a day (BID) | CUTANEOUS | 12 refills | Status: DC

## 2021-10-03 MED ORDER — ESTRADIOL 0.075 MG/24HR TD PTTW: 1.0000 | MEDICATED_PATCH | TRANSDERMAL | 4 refills | Status: DC

## 2021-10-03 MED ORDER — PROGESTERONE 200 MG PO CAPS: ORAL_CAPSULE | ORAL | 4 refills | Status: DC

## 2021-10-03 NOTE — Progress Notes (Signed)
56 y.o. G1P0 Single Black or [place] American female here for annual exam.  Feeling well.  Feeling like HRT is working better for her now with some adjustments made last year.  Does receive testosterone pellet placement from [facility].  Uses cyclic progesterone and does have small bleeding each month.  Is not heavy. ? ?Has some facial hair that is bothersome.  Considering laser treatment. ? ?Is dating someone now. Very happy about this. ? ?Patient's last menstrual period was 08/29/2018.          ?Sexually active: Yes.    ?The current method of family planning is post menopausal status.    ?Smoker:  no ? ?Health Maintenance: ?Pap:  09/28/2020 Negative ?History of abnormal Pap:  no ?MMG:  08/12/2021 Negative ?Colonoscopy:  cologuard 2020 ?BMD:   guidelines reviewed ?Screening Labs: Today ? ? reports that she has never smoked. She has never used smokeless tobacco. She reports current alcohol use. She reports that she does not use drugs. ? ?Past Medical History:  ?Diagnosis Date  ? Anxiety   ? Gonorrhea 1988  ? Headache   ? menstrual  ? Sickle cell trait ([place])   ?    ? Vaginal Pap smear, abnormal   ? White coat syndrome without diagnosis of hypertension 09/07/2018  ? ? ?Past Surgical History:  ?Procedure Laterality Date  ? ABLATION SAPHENOUS VEIN W/ RFA    ? Right leg  ? BREAST BIOPSY  06/10/2011  ? Procedure: BREAST BIOPSY WITH NEEDLE LOCALIZATION;  Surgeon: Imogene Burn. Georgette Dover, MD;  Location: [facility];  Service: General;  Laterality: Right;  Right needle localized lumpectomy  ? BREAST BIOPSY Right 05/2011  ? BREAST EXCISIONAL BIOPSY Right 06/10/2011  ? MYOMECTOMY N/A 06/27/2013  ? Procedure: VAGINAL MYOMECTOMY;  Surgeon: Lyman Speller, MD;  Location: [facility];  Service: Gynecology;  Laterality: N/A;  ? TOE SURGERY  7/09  ? broken toe  ? WISDOM TOOTH EXTRACTION    ? ? ?Current Outpatient Medications  ?Medication Sig Dispense Refill  ? Eflornithine HCl 13.9 % cream Apply 1 application. topically in the morning and at bedtime.  45 g 12  ? escitalopram (LEXAPRO) 10 MG tablet Take 2 tablets (20 mg total) by mouth daily. 180 tablet 4  ? estradiol (VIVELLE-DOT) 0.075 MG/24HR Place 1 patch onto the skin 2 (two) times a week. 24 patch 4  ? fish oil-omega-3 fatty acids 1000 MG capsule Take 1 g by mouth daily.    ? Multiple Vitamin (MULTIVITAMIN) tablet Take 1 tablet by mouth daily.    ? NON FORMULARY Testosterone pellet 137.5 mg    ? ?No current facility-administered medications for this visit.  ? ? ?Family History  ?Problem Relation Age of Onset  ? Hypertension Mother   ? Anxiety disorder Mother   ? Tuberculosis Father   ?     ?/not sure  ? Heart disease Maternal Grandmother   ? Hypertension Sister   ? ? ?Review of Systems  ?All other systems reviewed and are negative. ? ?Exam:   ?BP (!) 144/90 (BP Location: Right Arm, Patient Position: Sitting, Cuff Size: Large)   Pulse 85   Ht 5' 4.25" (1.632 m) Comment: Reported  Wt 177 lb 3.2 oz (80.4 kg)   LMP 08/29/2018   BMI 30.18 kg/m?   Height: 5' 4.25" (163.2 cm) (Reported) ? ?General appearance: alert, cooperative and appears stated age ?Head: Normocephalic, without obvious abnormality, atraumatic ?Neck: no adenopathy, supple, symmetrical, trachea midline and thyroid normal to inspection and  palpation ?Lungs: clear to auscultation bilaterally ?Breasts: normal appearance, no masses or tenderness ?Heart: regular rate and rhythm ?Abdomen: soft, non-tender; bowel sounds normal; no masses,  no organomegaly ?Extremities: extremities normal, atraumatic, no cyanosis or edema ?Skin: Skin color, texture, turgor normal. No rashes or lesions ?Lymph nodes: Cervical, supraclavicular, and axillary nodes normal. ?No abnormal inguinal nodes palpated ?Neurologic: Grossly normal ? ? ?Pelvic: External genitalia:  no lesions ?             Urethra:  normal appearing urethra with no masses, tenderness or lesions ?             Bartholins and Skenes: normal    ?             Vagina: normal appearing vagina with normal  color and no discharge, no lesions ?             Cervix: no lesions ?             Pap taken: Yes.   ?Bimanual Exam:  Uterus:  normal size, contour, position, consistency, mobility, non-tender ?             Adnexa: normal adnexa and no mass, fullness, tenderness ?              Rectovaginal: Confirms ?              Anus:  normal sphincter tone, no lesions ? ?Chaperone, Octaviano Batty, CMA, was present for exam. ? ?Assessment/Plan: ?1. Well woman exam with routine gynecological exam ?- Pap smear 09/2020.  Not indicated today. ?- Mammogram 08/2021 ?- Colonoscopy has been declined but willing to proceed with cologuard.  Order placed ?- Bone mineral density will be planned closer to age 49 ?- lab work done done with PCP ?- vaccines reviewed/updated ? ?2. Hirsutism ?- topical treatment discussed.  Pt will try on skin not on face first to ensure no reaction. ?- Eflornithine HCl 13.9 % cream; Apply 1 application. topically in the morning and at bedtime.  Dispense: 45 g; Refill: 12 ? ?3. Hormone replacement therapy (HRT) ?- continue current dosage ?- estradiol (VIVELLE-DOT) 0.075 MG/24HR; Place 1 patch onto the skin 2 (two) times a week.  Dispense: 24 patch; Refill: 4 ?- progesterone (PROMETRIUM) 200 MG capsule; Take 1 capsule daily days 1-14 each month  Dispense: 42 capsule; Refill: 4 ? ?4. Colon cancer screening ?- Cologuard ? ?5. Blood tests for routine general physical examination ?- CBC ?- Comprehensive metabolic panel ?- Hemoglobin A1c ?- TSH ?- Lipid panel  ? ?

## 2021-10-04 LAB — CBC
Hematocrit: 37.2 % (ref 34.0–46.6)
Hemoglobin: 12.1 g/dL (ref 11.1–15.9)
MCH: 24.5 pg — ABNORMAL LOW (ref 26.6–33.0)
MCHC: 32.5 g/dL (ref 31.5–35.7)
MCV: 76 fL — ABNORMAL LOW (ref 79–97)
Platelets: 370 10*3/uL (ref 150–450)
RBC: 4.93 x10E6/uL (ref 3.77–5.28)
RDW: 17.2 % — ABNORMAL HIGH (ref 11.7–15.4)
WBC: 8.8 10*3/uL (ref 3.4–10.8)

## 2021-10-04 LAB — COMPREHENSIVE METABOLIC PANEL
ALT: 12 IU/L (ref 0–32)
AST: 17 IU/L (ref 0–40)
Albumin/Globulin Ratio: 1.3 (ref 1.2–2.2)
Albumin: 4.2 g/dL (ref 3.8–4.9)
Alkaline Phosphatase: 67 IU/L (ref 44–121)
BUN/Creatinine Ratio: 7 — ABNORMAL LOW (ref 9–23)
BUN: 6 mg/dL (ref 6–24)
Bilirubin Total: 0.3 mg/dL (ref 0.0–1.2)
CO2: 25 mmol/L (ref 20–29)
Calcium: 8.7 mg/dL (ref 8.7–10.2)
Chloride: 104 mmol/L (ref 96–106)
Creatinine, Ser: 0.91 mg/dL (ref 0.57–1.00)
Globulin, Total: 3.2 g/dL (ref 1.5–4.5)
Glucose: 77 mg/dL (ref 70–99)
Potassium: 4.3 mmol/L (ref 3.5–5.2)
Sodium: 141 mmol/L (ref 134–144)
Total Protein: 7.4 g/dL (ref 6.0–8.5)
eGFR: 75 mL/min/{1.73_m2} (ref >59)

## 2021-10-04 LAB — LIPID PANEL
Chol/HDL Ratio: 3.2 ratio (ref 0.0–4.4)
Cholesterol, Total: 165 mg/dL (ref 100–199)
HDL: 51 mg/dL (ref >39)
LDL Chol Calc (NIH): 101 mg/dL — ABNORMAL HIGH (ref 0–99)
Triglycerides: 66 mg/dL (ref 0–149)
VLDL Cholesterol Cal: 13 mg/dL (ref 5–40)

## 2021-10-04 LAB — HEMOGLOBIN A1C
Est. average glucose Bld gHb Est-mCnc: 105 mg/dL
Hgb A1c MFr Bld: 5.3 % (ref 4.8–5.6)

## 2021-10-04 LAB — TSH: TSH: 2.2 u[IU]/mL (ref 0.450–4.500)

## 2021-10-07 LAB — CYTOLOGY - PAP
Adequacy: ABSENT
Diagnosis: NEGATIVE

## 2021-10-18 LAB — COLOGUARD: COLOGUARD: NEGATIVE

## 2021-11-12 ENCOUNTER — Encounter (HOSPITAL_BASED_OUTPATIENT_CLINIC_OR_DEPARTMENT_OTHER): Payer: Self-pay | Admitting: Obstetrics & Gynecology

## 2021-11-12 ENCOUNTER — Other Ambulatory Visit (HOSPITAL_BASED_OUTPATIENT_CLINIC_OR_DEPARTMENT_OTHER): Payer: Self-pay | Admitting: *Deleted

## 2021-11-12 MED ORDER — ESCITALOPRAM OXALATE 10 MG PO TABS: 20.0000 mg | ORAL_TABLET | Freq: Every day | ORAL | 0 refills | Status: DC

## 2021-11-12 NOTE — Progress Notes (Signed)
Pt requests refill on lexapro. Rx sent ?

## 2021-12-31 ENCOUNTER — Encounter (HOSPITAL_BASED_OUTPATIENT_CLINIC_OR_DEPARTMENT_OTHER): Payer: Self-pay | Admitting: Obstetrics & Gynecology

## 2022-02-08 ENCOUNTER — Other Ambulatory Visit (HOSPITAL_BASED_OUTPATIENT_CLINIC_OR_DEPARTMENT_OTHER): Payer: Self-pay | Admitting: Obstetrics & Gynecology

## 2022-02-11 NOTE — Telephone Encounter (Signed)
LMOVM for pt to call office regarding refill request

## 2022-04-22 ENCOUNTER — Encounter (HOSPITAL_BASED_OUTPATIENT_CLINIC_OR_DEPARTMENT_OTHER): Payer: Self-pay | Admitting: Obstetrics & Gynecology

## 2022-05-13 ENCOUNTER — Other Ambulatory Visit (HOSPITAL_BASED_OUTPATIENT_CLINIC_OR_DEPARTMENT_OTHER): Payer: Self-pay | Admitting: Obstetrics & Gynecology

## 2022-06-04 ENCOUNTER — Encounter (HOSPITAL_BASED_OUTPATIENT_CLINIC_OR_DEPARTMENT_OTHER): Payer: Self-pay | Admitting: *Deleted

## 2022-07-02 ENCOUNTER — Other Ambulatory Visit: Payer: Self-pay | Admitting: Obstetrics & Gynecology

## 2022-07-02 DIAGNOSIS — Z1231 Encounter for screening mammogram for malignant neoplasm of breast: Secondary | ICD-10-CM

## 2022-08-21 ENCOUNTER — Ambulatory Visit
Admission: RE | Admit: 2022-08-21 | Discharge: 2022-08-21 | Disposition: A | Discharge status: Home / Self Care | Payer: BC Managed Care – PPO | Source: Ambulatory Visit | Attending: Obstetrics & Gynecology | Admitting: Obstetrics & Gynecology

## 2022-08-21 DIAGNOSIS — Z1231 Encounter for screening mammogram for malignant neoplasm of breast: Secondary | ICD-10-CM

## 2022-08-26 ENCOUNTER — Encounter (HOSPITAL_BASED_OUTPATIENT_CLINIC_OR_DEPARTMENT_OTHER): Payer: Self-pay | Admitting: Obstetrics & Gynecology

## 2022-10-06 ENCOUNTER — Ambulatory Visit (HOSPITAL_BASED_OUTPATIENT_CLINIC_OR_DEPARTMENT_OTHER): Payer: BC Managed Care – PPO | Admitting: Obstetrics & Gynecology

## 2022-10-27 ENCOUNTER — Ambulatory Visit (INDEPENDENT_AMBULATORY_CARE_PROVIDER_SITE_OTHER): Payer: BC Managed Care – PPO | Admitting: Obstetrics & Gynecology

## 2022-10-27 ENCOUNTER — Encounter (HOSPITAL_BASED_OUTPATIENT_CLINIC_OR_DEPARTMENT_OTHER): Payer: Self-pay | Admitting: Obstetrics & Gynecology

## 2022-10-27 ENCOUNTER — Other Ambulatory Visit (HOSPITAL_COMMUNITY)
Admission: RE | Admit: 2022-10-27 | Discharge: 2022-10-27 | Disposition: A | Discharge status: Home / Self Care | Payer: BC Managed Care – PPO | Source: Ambulatory Visit | Attending: Obstetrics & Gynecology | Admitting: Obstetrics & Gynecology

## 2022-10-27 VITALS — BP 158/82 | HR 100 | Ht 64.5 in | Wt 171.0 lb

## 2022-10-27 DIAGNOSIS — R030 Elevated blood-pressure reading, without diagnosis of hypertension: Secondary | ICD-10-CM | POA: Diagnosis not present

## 2022-10-27 DIAGNOSIS — Z01419 Encounter for gynecological examination (general) (routine) without abnormal findings: Secondary | ICD-10-CM | POA: Diagnosis not present

## 2022-10-27 DIAGNOSIS — Z79890 Hormone replacement therapy (postmenopausal): Secondary | ICD-10-CM

## 2022-10-27 DIAGNOSIS — F32A Depression, unspecified: Secondary | ICD-10-CM | POA: Diagnosis not present

## 2022-10-27 DIAGNOSIS — D241 Benign neoplasm of right breast: Secondary | ICD-10-CM

## 2022-10-27 DIAGNOSIS — Z124 Encounter for screening for malignant neoplasm of cervix: Secondary | ICD-10-CM

## 2022-10-27 DIAGNOSIS — Z9229 Personal history of other drug therapy: Secondary | ICD-10-CM

## 2022-10-27 MED ORDER — PROGESTERONE 200 MG PO CAPS: ORAL_CAPSULE | ORAL | 4 refills | Status: DC

## 2022-10-27 MED ORDER — ESTRADIOL 0.05 MG/24HR TD PTTW: 1.0000 | MEDICATED_PATCH | TRANSDERMAL | 3 refills | Status: DC

## 2022-10-27 MED ORDER — ESCITALOPRAM OXALATE 20 MG PO TABS: 20.0000 mg | ORAL_TABLET | Freq: Every day | ORAL | 3 refills | Status: DC

## 2022-10-27 NOTE — Progress Notes (Signed)
57 y.o. G1P0 Single Black or [place] American female here for annual exam. 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  On HRT and receiving HRT with testosterone.  On estradiol patches.  Taking cyclic progesterone every month.  Does have withdrawal bleeding.  Bleeding is not heavy and tapers off.    Has noticed some melasma on her face.  Wants to know if should see a dermatologist.    Patient's last menstrual period was 08/29/2018.          Sexually active: Yes.    The current method of family planning is post menopausal status.    Exercising: "not like I used to" Smoker:  no  Health Maintenance: Pap:  10/03/2021 Negative History of abnormal Pap:  no MMG:  08/21/2022 Negative Colonoscopy:  10/2021 cologuard BMD:   guidelines reviewed Screening Labs: 09/2021   reports that she has never smoked. She has never used smokeless tobacco. She reports current alcohol use. She reports that she does not use drugs.  Past Medical History:  Diagnosis Date   Anxiety    Gonorrhea 1988   Headache    menstrual   Sickle cell trait        Vaginal Pap smear, abnormal    White coat syndrome without diagnosis of hypertension 09/07/2018    Past Surgical History:  Procedure Laterality Date   ABLATION SAPHENOUS VEIN W/ RFA     Right leg   BREAST BIOPSY  06/10/2011   Procedure: BREAST BIOPSY WITH NEEDLE LOCALIZATION;  Surgeon: Wilmon Arms. Corliss Skains, MD;  Location: [facility];  Service: General;  Laterality: Right;  Right needle localized lumpectomy   BREAST BIOPSY Right 05/2011   BREAST EXCISIONAL BIOPSY Right 06/10/2011   MYOMECTOMY N/A 06/27/2013   Procedure: VAGINAL MYOMECTOMY;  Surgeon: Annamaria Boots, MD;  Location: [facility];  Service: Gynecology;  Laterality: N/A;   TOE SURGERY  7/09   broken toe   WISDOM TOOTH EXTRACTION      Current Outpatient Medications  Medication Sig Dispense Refill   escitalopram (LEXAPRO) 20 MG tablet TAKE 1 TABLET BY MOUTH EVERY DAY (INS REQ  TABS) 180 tablet 0   estradiol  (VIVELLE-DOT) 0.075 MG/24HR Place 1 patch onto the skin 2 (two) times a week. 24 patch 4   fish oil-omega-3 fatty acids 1000 MG capsule Take 1 g by mouth daily.     Multiple Vitamin (MULTIVITAMIN) tablet Take 1 tablet by mouth daily.     NON FORMULARY Testosterone pellet 137.5 mg     progesterone (PROMETRIUM) 200 MG capsule Take 1 capsule daily days 1-14 each month 42 capsule 4   No current facility-administered medications for this visit.    Family History  Problem Relation Age of Onset   Hypertension Mother    Anxiety disorder Mother    Tuberculosis Father        ?/not sure   Heart disease Maternal Grandmother    Hypertension Sister     ROS: Constitutional: negative Genitourinary:negative  Exam:   BP (!) 158/82   Pulse 100   Ht 5' 4.5" (1.638 m) Comment: Reported  Wt 171 lb (77.6 kg)   LMP 08/29/2018   BMI 28.90 kg/m   Height: 5' 4.5" (163.8 cm) (Reported)  General appearance: alert, cooperative and appears stated age Head: Normocephalic, without obvious abnormality, atraumatic Neck: no adenopathy, supple, symmetrical, trachea midline and thyroid normal to inspection and palpation Lungs: clear to auscultation bilaterally Breasts: normal appearance, no masses or tenderness Heart: regular rate and rhythm Abdomen: soft,  non-tender; bowel sounds normal; no masses,  no organomegaly Extremities: extremities normal, atraumatic, no cyanosis or edema Skin: Skin color, texture, turgor normal. No rashes or lesions Lymph nodes: Cervical, supraclavicular, and axillary nodes normal. No abnormal inguinal nodes palpated Neurologic: Grossly normal   Pelvic: External genitalia:  no lesions              Urethra:  normal appearing urethra with no masses, tenderness or lesions              Bartholins and Skenes: normal                 Vagina: normal appearing vagina with normal color and no discharge, no lesions              Cervix: no lesions              Pap taken: Yes.   Bimanual  Exam:  Uterus:  normal size, contour, position, consistency, mobility, non-tender              Adnexa: normal adnexa and no mass, fullness, tenderness               Rectovaginal: Confirms               Anus:  normal sphincter tone, no lesions  Chaperone, Ina Homes, CMA, was present for exam.  Assessment/Plan: 1. Well woman exam with routine gynecological exam - Pap smear obtained today.  Will obtained HR HPV.  Pt requests yearly pap smear. - Mammogram 08/21/2022 - Colonoscopy declined.  Cologuard negative 2023. - Bone mineral density guidelines discussed - lab work done last year - vaccines reviewed/updated  2. Elevated blood pressure reading without diagnosis of hypertension - pt is monitor at home  3. Cervical cancer screening - Cytology - PAP( [place])  4, Mild depression - escitalopram (LEXAPRO) 20 MG tablet; Take 1 tablet (20 mg total) by mouth daily.  Dispense: 90 tablet; Refill: 3  5. HRT - will decrease estrogen dosage to 0.05mg  patches. - progesterone (PROMETRIUM) 200 MG capsule; Take 1 capsule daily days 1-14 each month  Dispense: 42 capsule; Refill: 4 - estradiol (VIVELLE-DOT) 0.05 MG/24HR patch; Place 1 patch (0.05 mg total) onto the skin 2 (two) times a week.  Dispense: 24 patch; Refill: 3

## 2022-10-30 LAB — CYTOLOGY - PAP
Comment: NEGATIVE
Diagnosis: NEGATIVE
High risk HPV: NEGATIVE

## 2022-11-03 ENCOUNTER — Ambulatory Visit
Admission: RE | Admit: 2022-11-03 | Discharge: 2022-11-03 | Disposition: A | Discharge status: Home / Self Care | Payer: BC Managed Care – PPO | Source: Ambulatory Visit | Attending: Nurse Practitioner | Admitting: Nurse Practitioner

## 2022-11-03 VITALS — BP 151/97 | HR 98 | Temp 98.5°F | Resp 17

## 2022-11-03 DIAGNOSIS — L02416 Cutaneous abscess of left lower limb: Secondary | ICD-10-CM | POA: Diagnosis not present

## 2022-11-03 MED ORDER — DOXYCYCLINE HYCLATE 100 MG PO CAPS: 100.0000 mg | ORAL_CAPSULE | Freq: Two times a day (BID) | ORAL | 0 refills | Status: AC

## 2022-11-03 NOTE — ED Provider Notes (Signed)
[facility]    CSN: [id number] Arrival date & time: 11/03/22  1858      History   Chief Complaint Chief Complaint  Patient presents with   Abscess    HPI Erin [name] is a 57 y.o. female presents for evaluation of abscess.  Patient reports 5 days ago she noticed a bump on the back of her left thigh that she scratched and later that day tried to drain.  Since then she has been noticing it has been getting larger, is red and swollen and tender.  She states it began draining on its own yesterday.  No fevers or chills.  No history of MRSA.  No other concerns at this time.   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 Corliss Skains, MD;  Location: [facility];  Service: General;  Laterality: Right;  Right needle localized lumpectomy   BREAST BIOPSY Right 05/2011   BREAST EXCISIONAL BIOPSY Right 06/10/2011   MYOMECTOMY N/A 06/27/2013   Procedure: VAGINAL MYOMECTOMY;  Surgeon: Annamaria Boots, MD;  Location: [facility];  Service: Gynecology;  Laterality: N/A;   TOE SURGERY  7/09   broken toe   WISDOM TOOTH EXTRACTION      OB History     Gravida  1   Para  0   Term      Preterm      AB      Living  0      SAB       IAB      Ectopic      Multiple      Live Births               Home Medications    Prior to Admission medications   Medication Sig Start Date End Date Taking? Authorizing Provider  doxycycline (VIBRAMYCIN) 100 MG capsule Take 1 capsule (100 mg total) by mouth 2 (two) times daily for 10 days. 11/03/22 11/13/22 Yes Radford Pax, NP  escitalopram (LEXAPRO) 20 MG tablet Take 1 tablet (20 mg total) by mouth daily. 10/27/22   Jerene Bears, MD  estradiol (VIVELLE-DOT) 0.05 MG/24HR patch Place 1 patch (0.05 mg total) onto the skin 2 (two) times a week. 10/27/22   Jerene Bears, MD  fish oil-omega-3 fatty acids 1000 MG capsule Take 1 g by mouth daily.    [provider]  Multiple Vitamin (MULTIVITAMIN) tablet Take 1 tablet by mouth daily.    [provider]  NON FORMULARY Testosterone pellet 137.5 mg    [provider]  progesterone (PROMETRIUM) 200 MG capsule Take 1 capsule  daily days 1-14 each month 10/27/22   Jerene Bears, MD    Family History Family History  Problem Relation Age of Onset   Hypertension Mother    Anxiety disorder Mother    Tuberculosis Father        ?/not sure   Heart disease Maternal Grandmother    Hypertension Sister     Social History Social History   Tobacco Use   Smoking status: Never   Smokeless tobacco: Never  Vaping Use   Vaping Use: Never used  Substance Use Topics   Alcohol use: Yes    Alcohol/week: 0.0 - 1.0 standard drinks of alcohol   Drug use: No     Allergies   Prozac [fluoxetine] and Sulfamethoxazole   Review of Systems Review of Systems  Skin:  Positive for wound.     Physical Exam Triage Vital Signs ED Triage Vitals  Enc Vitals Group     BP 11/03/22 1907 (!) 151/97     Pulse Rate 11/03/22 1907 98     Resp 11/03/22 1907 17     Temp 11/03/22 1907 98.5 F (36.9 C)     Temp Source 11/03/22 1907 Oral     SpO2 11/03/22 1907 97 %     Weight --      Height --      Head Circumference --       Peak Flow --      Pain Score 11/03/22 1906 8     Pain Loc --      Pain Edu? --      Excl. in GC? --    No data found.  Updated Vital Signs BP (!) 151/97 (BP Location: Right Arm)   Pulse 98   Temp 98.5 F (36.9 C) (Oral)   Resp 17   LMP 10/19/2022 (Approximate)   SpO2 97%   Visual Acuity Right Eye Distance:   Left Eye Distance:   Bilateral Distance:    Right Eye Near:   Left Eye Near:    Bilateral Near:     Physical Exam Vitals and nursing note reviewed.  Constitutional:      General: She is not in acute distress.    Appearance: Normal appearance. She is not ill-appearing.  HENT:     Head: Normocephalic and atraumatic.  Eyes:     Pupils: Pupils are equal, round, and reactive to light.  Cardiovascular:     Rate and Rhythm: Normal rate.  Pulmonary:     Effort: Pulmonary effort is normal.  Skin:    General: Skin is warm and dry.          Comments: 4cmx4cm indurated nonfluctuant mildly erythematous tender abscess to the posterior aspect of the left inner thigh.  No active drainage.  Neurological:     General: No focal deficit present.     Mental Status: She is alert and oriented to person, place, and time.  Psychiatric:        Mood and Affect: Mood normal.        Behavior: Behavior normal.      UC Treatments / Results  Labs (all labs ordered are listed, but only abnormal results are displayed) Labs Reviewed - No data to display  EKG   Radiology No results found.  Procedures Procedures (including critical care time)  Medications Ordered in UC Medications - No data to display  Initial Impression / Assessment and Plan / UC Course  I have reviewed the triage vital signs and the nursing  notes.  Pertinent labs & imaging results that were available during my care of the patient were reviewed by me and considered in my medical decision making (see chart for details).     Discussed with patient abscess.  No indication for I&D at this time.  Will  start doxycycline and patient instructed to use warm compresses Follow-up with PCP if symptoms do not improve ER precautions reviewed and patient verbalized understanding Final Clinical Impressions(s) / UC Diagnoses   Final diagnoses:  Abscess of left thigh     Discharge Instructions      Start doxycycline twice daily for 10 days Warm compresses to the area as needed will encourage it to drain on its own Please follow-up with your PCP if your symptoms or not improving Please go to the emergency room if you develop any worsening symptoms   ED Prescriptions     Medication Sig Dispense Auth. Provider   doxycycline (VIBRAMYCIN) 100 MG capsule Take 1 capsule (100 mg total) by mouth 2 (two) times daily for 10 days. 20 capsule Radford Pax, NP      PDMP not reviewed this encounter.   Radford Pax, NP 11/03/22 [phone]

## 2022-11-03 NOTE — ED Triage Notes (Signed)
Pt had bump on left posterior leg since last Week. Popped the other day and now bump huge and painful.

## 2022-11-03 NOTE — Discharge Instructions (Signed)
Start doxycycline twice daily for 10 days Warm compresses to the area as needed will encourage it to drain on its own Please follow-up with your PCP if your symptoms or not improving Please go to the emergency room if you develop any worsening symptoms

## 2022-11-06 ENCOUNTER — Telehealth: Payer: Self-pay

## 2022-11-06 NOTE — Telephone Encounter (Signed)
Pt called stating her wound was still draining pus. Pt wanted to know if she should come in to be seen and have the wound drained by a provider. Pt was reassured that by this nurse, as stated in the provider's note from her visit, that the goal is to have the wound drain on its own, to continue to keep the wound clean, and to continue taking the prescribed antibiotics. Pt is informed that if the wound looks worse, that she should go to the emergency department.

## 2022-11-20 ENCOUNTER — Encounter (HOSPITAL_BASED_OUTPATIENT_CLINIC_OR_DEPARTMENT_OTHER): Payer: Self-pay | Admitting: *Deleted

## 2022-11-26 ENCOUNTER — Encounter (HOSPITAL_BASED_OUTPATIENT_CLINIC_OR_DEPARTMENT_OTHER): Payer: Self-pay | Admitting: Obstetrics & Gynecology

## 2022-11-26 ENCOUNTER — Other Ambulatory Visit (HOSPITAL_BASED_OUTPATIENT_CLINIC_OR_DEPARTMENT_OTHER): Payer: Self-pay | Admitting: Obstetrics & Gynecology

## 2022-11-26 DIAGNOSIS — L811 Chloasma: Secondary | ICD-10-CM

## 2022-12-27 ENCOUNTER — Encounter (HOSPITAL_BASED_OUTPATIENT_CLINIC_OR_DEPARTMENT_OTHER): Payer: Self-pay | Admitting: Obstetrics & Gynecology

## 2022-12-29 NOTE — Telephone Encounter (Signed)
Called pt in response to [employer]. Provided pt with virtual appt to discuss symptoms/treatment options.

## 2022-12-30 ENCOUNTER — Telehealth (HOSPITAL_BASED_OUTPATIENT_CLINIC_OR_DEPARTMENT_OTHER): Payer: BC Managed Care – PPO | Admitting: Obstetrics & Gynecology

## 2022-12-30 ENCOUNTER — Ambulatory Visit
Admission: RE | Admit: 2022-12-30 | Discharge: 2022-12-30 | Disposition: A | Discharge status: Home / Self Care | Payer: BC Managed Care – PPO | Source: Ambulatory Visit | Attending: Nurse Practitioner | Admitting: Nurse Practitioner

## 2022-12-30 VITALS — BP 163/89 | HR 93 | Temp 98.1°F | Resp 16

## 2022-12-30 DIAGNOSIS — S81802A Unspecified open wound, left lower leg, initial encounter: Secondary | ICD-10-CM | POA: Diagnosis not present

## 2022-12-30 DIAGNOSIS — L989 Disorder of the skin and subcutaneous tissue, unspecified: Secondary | ICD-10-CM | POA: Diagnosis not present

## 2022-12-30 MED ORDER — MUPIROCIN CALCIUM 2 % EX CREA
1.0000 | TOPICAL_CREAM | Freq: Two times a day (BID) | CUTANEOUS | 0 refills | Status: DC
Start: 2022-12-30 — End: 2023-05-25

## 2022-12-30 NOTE — ED Provider Notes (Signed)
[facility]    CSN: [id number] Arrival date & time: 12/30/22  1858      History   Chief Complaint Chief Complaint  Patient presents with   Wound Check    I was there for an abscess in April and it seems like it's not healing b - Entered by patient    HPI Erin [name] is a 57 y.o. female presents for evaluation of a sore on her leg.  Patient was seen in urgent care on April 29 for an abscess to her left upper thigh.  It was already draining so she was started on doxycycline.  She completed this as prescribed but states there is been a sore there that has not healed since that time.  No drainage or swelling.  No fevers or chills.  No lethargy.  No history of MRSA.  She has been using over-the-counter hydrocolloid bandages with no improvement.  No other concerns at this time.   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 Corliss Skains, MD;  Location: [facility];  Service: General;  Laterality: Right;  Right needle localized lumpectomy   BREAST BIOPSY Right 05/2011   BREAST EXCISIONAL BIOPSY Right 06/10/2011   MYOMECTOMY N/A 06/27/2013   Procedure: VAGINAL MYOMECTOMY;  Surgeon: Annamaria Boots, MD;  Location: [facility];  Service: Gynecology;  Laterality: N/A;   TOE SURGERY  7/09   broken toe   WISDOM TOOTH EXTRACTION      OB History     Gravida  1   Para  0   Term      Preterm      AB      Living  0      SAB      IAB      Ectopic      Multiple      Live Births               Home Medications    Prior to Admission medications   Medication Sig Start Date End Date Taking? Authorizing Provider  mupirocin cream (BACTROBAN) 2 % Apply 1 Application topically 2 (two) times daily. 12/30/22  Yes Radford Pax, NP  escitalopram (LEXAPRO) 20 MG tablet Take 1 tablet (20 mg total) by mouth daily. 10/27/22   Jerene Bears, MD  estradiol (VIVELLE-DOT) 0.05 MG/24HR patch Place 1 patch (0.05 mg total) onto the skin 2 (two) times a week. 10/27/22   Jerene Bears, MD  fish oil-omega-3 fatty acids 1000 MG capsule Take 1 g by mouth daily.    [provider]  Multiple  Vitamin (MULTIVITAMIN) tablet Take 1 tablet by mouth daily.    [provider]  NON FORMULARY Testosterone pellet 137.5 mg    [provider]  progesterone (PROMETRIUM) 200 MG capsule Take 1 capsule daily days 1-14 each month 10/27/22   Jerene Bears, MD    Family History Family History  Problem Relation Age of Onset   Hypertension Mother    Anxiety disorder Mother    Tuberculosis Father        ?/not sure   Heart disease Maternal Grandmother    Hypertension Sister     Social History Social History   Tobacco Use   Smoking status: Never   Smokeless tobacco: Never  Vaping Use   Vaping Use: Never used  Substance Use Topics   Alcohol use: Yes    Alcohol/week: 0.0 - 1.0 standard drinks of alcohol   Drug use: No     Allergies   Prozac [fluoxetine] and Sulfamethoxazole   Review of Systems Review of Systems  Skin:        Sore on leg     Physical Exam Triage Vital Signs ED Triage Vitals  Enc Vitals Group     BP 12/30/22 1910 (!) 163/89     Pulse Rate 12/30/22 1909 93     Resp  12/30/22 1909 16     Temp 12/30/22 1909 98.1 F (36.7 C)     Temp Source 12/30/22 1909 Oral     SpO2 12/30/22 1909 97 %     Weight --      Height --      Head Circumference --      Peak Flow --      Pain Score 12/30/22 1910 0     Pain Loc --      Pain Edu? --      Excl. in GC? --    No data found.  Updated Vital Signs BP (!) 163/89 (BP Location: Left Arm)   Pulse 93   Temp 98.1 F (36.7 C) (Oral)   Resp 16   SpO2 97%   Visual Acuity Right Eye Distance:   Left Eye Distance:   Bilateral Distance:    Right Eye Near:   Left Eye Near:    Bilateral Near:     Physical Exam Vitals and nursing note reviewed.  Constitutional:      Appearance: Normal appearance.  HENT:     Head: Normocephalic and atraumatic.  Eyes:     Pupils: Pupils are equal, round, and reactive to light.  Cardiovascular:     Rate and Rhythm: Normal rate.  Pulmonary:     Effort: Pulmonary effort is normal.  Skin:    General: Skin is warm and dry.          Comments: 1 x 1 cm superficial sore to the left posterior inner thigh.  There is no induration or fluctuance.  No warmth or swelling.  Nontender to palpation.  Neurological:     General: No focal deficit present.     Mental Status: She is alert and oriented to person, place, and time.  Psychiatric:        Mood and Affect: Mood normal.        Behavior: Behavior normal.      UC Treatments / Results  Labs (all labs ordered are listed, but only abnormal results are displayed) Labs Reviewed - No data to display  EKG   Radiology No results found.  Procedures Procedures (including critical care time)  Medications Ordered in UC Medications - No data to display  Initial Impression / Assessment and Plan / UC Course  I have reviewed the triage vital signs and the nursing notes.  Pertinent labs & imaging results that were available during my care of the patient were reviewed by me and considered in my medical decision making (see chart for  details).     Persistent sore to left thigh that did not resolve with doxycycline.  No signs of abscess or cellulitis.  Will do mupirocin topically and will send to dermatology.  Also advised that she follow-up with her PCP in 1 week if it does not start to improve as she may need wound care consult.  Wound care reviewed.  ER precautions reviewed and patient verbalized understanding Final Clinical Impressions(s) / UC Diagnoses   Final diagnoses:  Sore on leg  Non-healing wound of left lower extremity     Discharge Instructions      Keep area clean and dry and start mupirocin topical antibiotic twice daily.  Follow-up with dermatology for further treatment and evaluation.  Would also follow-up with your PCP in 1 week for recheck.  Please go to the ER for any worsening symptoms.    ED Prescriptions     Medication Sig Dispense Auth. Provider   mupirocin cream (BACTROBAN) 2 % Apply 1 Application topically 2 (two) times daily. 30 g Radford Pax, NP      PDMP not reviewed this encounter.   Radford Pax, NP 12/30/22 1943

## 2022-12-30 NOTE — ED Triage Notes (Signed)
Pt states she had an abscess to her left upper leg, was seen here in April for it and was given antibiotics  States it never healed.

## 2022-12-30 NOTE — Discharge Instructions (Signed)
Keep area clean and dry and start mupirocin topical antibiotic twice daily.  Follow-up with dermatology for further treatment and evaluation.  Would also follow-up with your PCP in 1 week for recheck.  Please go to the ER for any worsening symptoms.

## 2023-02-03 ENCOUNTER — Ambulatory Visit
Admission: RE | Admit: 2023-02-03 | Discharge: 2023-02-03 | Disposition: A | Discharge status: Home / Self Care | Payer: BC Managed Care – PPO | Source: Ambulatory Visit | Attending: Emergency Medicine | Admitting: Emergency Medicine

## 2023-02-03 VITALS — BP 138/93 | HR 90 | Temp 99.0°F | Resp 17

## 2023-02-03 DIAGNOSIS — U071 COVID-19: Secondary | ICD-10-CM | POA: Diagnosis not present

## 2023-02-03 MED ORDER — HYDROCOD POLI-CHLORPHE POLI ER 10-8 MG/5ML PO SUER: 5.0000 mL | Freq: Two times a day (BID) | ORAL | 0 refills | Status: DC | PRN

## 2023-02-03 MED ORDER — NAPROXEN 500 MG PO TABS: 500.0000 mg | ORAL_TABLET | Freq: Two times a day (BID) | ORAL | 0 refills | Status: DC

## 2023-02-03 MED ORDER — FLUTICASONE PROPIONATE 50 MCG/ACT NA SUSP: 2.0000 | Freq: Every day | NASAL | 0 refills | Status: DC

## 2023-02-03 NOTE — Discharge Instructions (Signed)
Take the Tussionex twice a day as needed for cough, body aches.  Take the Naprosyn/milligrams of Tylenol twice a day.  This will help with body aches, headaches.  Saline nasal irrigation with a [employer] Huger Med rinse and distilled water as often as you want, Mucinex D, Flonase to help prevent a bacterial sinus infection.  Discontinue Excedrin.

## 2023-02-03 NOTE — ED Provider Notes (Signed)
HPI  SUBJECTIVE:  Erin [name] is a 57 y.o. female who presents with headache, cough, body aches, nasal congestion, rhinorrhea, sore throat and postnasal drip starting yesterday.  She states that she had a positive home COVID test today.  No wheezing, shortness of breath, nausea, vomiting, diarrhea, abdominal pain, fevers.  She got 4-5 doses of the COVID-vaccine. she is unable to sleep because of the cough.  She has been taking Excedrin and Tylenol, last dose of Excedrin within 6 hours of evaluation.  These medications help.  No aggravating factors.  She has no past medical history.  PCP: OB/GYN at [facility].    Past Medical History:  Diagnosis Date   Anxiety    Gonorrhea 1988   Headache    menstrual   Sickle cell trait (HCC)        Vaginal Pap smear, abnormal    White coat syndrome without diagnosis of hypertension 09/07/2018    Past Surgical History:  Procedure Laterality Date   ABLATION SAPHENOUS VEIN W/ RFA     Right leg   BREAST BIOPSY  06/10/2011   Procedure: BREAST BIOPSY WITH NEEDLE LOCALIZATION;  Surgeon: Wilmon Arms. Corliss Skains, MD;  Location: [facility] OR;  Service: General;  Laterality: Right;  Right needle localized lumpectomy   BREAST BIOPSY Right 05/2011   BREAST EXCISIONAL BIOPSY Right 06/10/2011   MYOMECTOMY N/A 06/27/2013   Procedure: VAGINAL MYOMECTOMY;  Surgeon: Annamaria Boots, MD;  Location: [facility] ORS;  Service: Gynecology;  Laterality: N/A;   TOE SURGERY  7/09   broken toe   WISDOM TOOTH EXTRACTION      Family History  Problem Relation Age of Onset   Hypertension Mother    Anxiety disorder Mother    Tuberculosis Father        ?/not sure   Heart disease Maternal Grandmother    Hypertension Sister     Social History   Tobacco Use   Smoking status: Never   Smokeless tobacco: Never  Vaping Use   Vaping status: Never Used  Substance Use Topics   Alcohol use: Yes    Alcohol/week: 0.0 - 1.0 standard drinks of alcohol   Drug use: No    No current  facility-administered medications for this encounter.  Current Outpatient Medications:    chlorpheniramine-HYDROcodone (TUSSIONEX) 10-8 MG/5ML, Take 5 mLs by mouth every 12 (twelve) hours as needed for cough., Disp: 60 mL, Rfl: 0   fluticasone (FLONASE) 50 MCG/ACT nasal spray, Place 2 sprays into both nostrils daily., Disp: 16 g, Rfl: 0   naproxen (NAPROSYN) 500 MG tablet, Take 1 tablet (500 mg total) by mouth 2 (two) times daily., Disp: 20 tablet, Rfl: 0   escitalopram (LEXAPRO) 20 MG tablet, Take 1 tablet (20 mg total) by mouth daily., Disp: 90 tablet, Rfl: 3   estradiol (VIVELLE-DOT) 0.05 MG/24HR patch, Place 1 patch (0.05 mg total) onto the skin 2 (two) times a week., Disp: 24 patch, Rfl: 3   fish oil-omega-3 fatty acids 1000 MG capsule, Take 1 g by mouth daily., Disp: , Rfl:    Multiple Vitamin (MULTIVITAMIN) tablet, Take 1 tablet by mouth daily., Disp: , Rfl:    mupirocin cream (BACTROBAN) 2 %, Apply 1 Application topically 2 (two) times daily., Disp: 30 g, Rfl: 0   NON FORMULARY, Testosterone pellet 137.5 mg, Disp: , Rfl:    progesterone (PROMETRIUM) 200 MG capsule, Take 1 capsule daily days 1-14 each month, Disp: 42 capsule, Rfl: 4  Allergies  Allergen Reactions   Prozac [  Fluoxetine]     Suicidal thoughts and depression   Sulfamethoxazole     REACTION: hives     ROS  As noted in HPI.   Physical Exam  BP (!) 138/93   Pulse 90   Temp 99 F (37.2 C) (Oral)   Resp 17   LMP 01/20/2023 (Exact Date)   SpO2 97%   Constitutional: Well developed, well nourished, no acute distress Eyes:  EOMI, conjunctiva normal bilaterally HENT: Normocephalic, atraumatic,mucus membranes moist.  Positive nasal congestion.  No maxillary, frontal sinus tenderness.  Normal oropharynx.  Normal tonsils without exudates.  Uvula midline. Neck: Positive cervical lymphadenopathy Respiratory: Normal inspiratory effort clear bilaterally, good air movement Cardiovascular: Normal rate, regular rhythm, no  murmurs rubs or gallops GI: nondistended skin: No rash, skin intact Musculoskeletal: no deformities Neurologic: Alert & oriented x 3, no focal neuro deficits Psychiatric: Speech and behavior appropriate   ED Course   Medications - No data to display  No orders of the defined types were placed in this encounter.   No results found for this or any previous visit (from the past 24 hour(s)). No results found.  ED Clinical Impression  1. COVID-19 virus infection      ED Assessment/Plan      [place] Narcotic database reviewed for this patient, and feel that the risk/benefit ratio today is favorable for proceeding with a prescription for controlled substance.  Patient is not a candidate for antivirals as she has no comorbidities and is fully vaccinated.  Will send home with Tussionex for body aches and cough, Naprosyn/Tylenol, she is to discontinue the Excedrin.  Mucinex D, saline nasal irrigation, work note.  Discussed labs, MDM, treatment plan, and plan for follow-up with patient. . patient agrees with plan.   Meds ordered this encounter  Medications   fluticasone (FLONASE) 50 MCG/ACT nasal spray    Sig: Place 2 sprays into both nostrils daily.    Dispense:  16 g    Refill:  0   naproxen (NAPROSYN) 500 MG tablet    Sig: Take 1 tablet (500 mg total) by mouth 2 (two) times daily.    Dispense:  20 tablet    Refill:  0   chlorpheniramine-HYDROcodone (TUSSIONEX) 10-8 MG/5ML    Sig: Take 5 mLs by mouth every 12 (twelve) hours as needed for cough.    Dispense:  60 mL    Refill:  0      *This clinic note was created using Scientist, clinical (histocompatibility and immunogenetics). Therefore, there may be occasional mistakes despite careful proofreading.  ?    Domenick Gong, MD 02/03/23 1243

## 2023-02-03 NOTE — ED Triage Notes (Signed)
Pt presents with a cough, headache and body aches X 2 days.   State she has taken Tylenol, Excedrin. Reports the Excedrin helped give relief for headaches.   States she took an at home covid test this morning, reports it was positive.

## 2023-02-18 ENCOUNTER — Encounter: Payer: Self-pay | Admitting: Dermatology

## 2023-02-18 ENCOUNTER — Ambulatory Visit (INDEPENDENT_AMBULATORY_CARE_PROVIDER_SITE_OTHER): Payer: BC Managed Care – PPO | Admitting: Dermatology

## 2023-02-18 VITALS — BP 123/85

## 2023-02-18 DIAGNOSIS — D170 Benign lipomatous neoplasm of skin and subcutaneous tissue of head, face and neck: Secondary | ICD-10-CM

## 2023-02-18 DIAGNOSIS — L219 Seborrheic dermatitis, unspecified: Secondary | ICD-10-CM | POA: Diagnosis not present

## 2023-02-18 DIAGNOSIS — L811 Chloasma: Secondary | ICD-10-CM

## 2023-02-18 MED ORDER — SAFETY SEAL MISCELLANEOUS MISC: 1.0000 | Freq: Every evening | 2 refills | Status: DC

## 2023-02-18 MED ORDER — NYSTATIN-TRIAMCINOLONE 100000-0.1 UNIT/GM-% EX OINT: 1.0000 | TOPICAL_OINTMENT | Freq: Two times a day (BID) | CUTANEOUS | 1 refills | Status: DC

## 2023-02-18 NOTE — Progress Notes (Signed)
   New Patient Visit   Subjective  Erin [name] is a 57 y.o. female who presents for the following: discoloration on both cheeks x 6 months-1 year. She tried Faded topical cream x 6 months.   She also has a bump under her skin on her right forehead x 1 year. No pain or changes noticed. It gets itchy sometimes.  She has itching and dryness behind both ears x 6 months. She has used OTC hydrocortisone cream which helps with symptoms.  The following portions of the chart were reviewed this encounter and updated as appropriate: medications, allergies, medical history  Review of Systems:  No other skin or systemic complaints except as noted in HPI or Assessment and Plan.  Objective  Well appearing patient in no apparent distress; mood and affect are within normal limits.          A focused examination was performed of the following areas: Ears, face  Relevant exam findings are noted in the Assessment and Plan.    Assessment & Plan   MELASMA Exam: reticulated hyperpigmented patches at face Bilateral cheeks and temples  Not at goal  Melasma is a chronic; persistent condition of hyperpigmented patches generally on the face, worse in summer due to higher UV exposure.    Heredity; thyroid disease; sun exposure; pregnancy; birth control pills; epilepsy medication and darker skin may predispose to Melasma.     Treatment Plan: Medrock skin lightening compound every other night for 2 weeks then nightly x 3 months Advised sunscreen Avoid SA acid products   SEBORRHEIC DERMATITIS Exam: Pink patches with greasy scale at postauricular ears Flared  Seborrheic Dermatitis is a chronic persistent rash characterized by pinkness and scaling most commonly of the mid face but also can occur on the scalp (dandruff), ears; mid chest, mid back and groin.  It tends to be exacerbated by stress and cooler weather.  People who have neurologic disease may experience new onset or exacerbation of  existing seborrheic dermatitis.  The condition is not curable but treatable and can be controlled.  Treatment Plan: Nystatin/Triamcinolone BID for up to 10 days for flares   Lipoma  Exam: Subcutaneous rubbery nodule(s) Location: right forehead  Benign-appearing. Exam most consistent with a lipoma. Discussed that a lipoma is a benign fatty growth that can grow over time and sometimes get irritated. Recommend observation if it is not bothersome or changing. Discussed option of ILK injections or surgical excision to remove it if it is growing, symptomatic, or other changes noted. Please call for new or changing lesions so they can be evaluated.   No follow-ups on file.  Jaclynn Guarneri, CMA, am acting as scribe for [employer], DO.   Documentation: I have reviewed the above documentation for accuracy and completeness, and I agree with the above.  Langston Reusing, DO

## 2023-02-18 NOTE — Patient Instructions (Addendum)
Hello Erin [name],  Thank you for visiting my office today. I appreciate your dedication to enhancing your skin health. Below is a summary of the essential instructions from our discussion:  Melasma - Compound Cream: Apply to face every other night for 2 weeks and then increase to every night. The [facility] compounding pharmacy will be in touch with you to provide further details and arrange for the cream to be mailed to you.   Rash Behind Ears - Nystatin and Triamcinolone Cream: Apply this twice daily for up to one week as needed for the treatment of seborrheic dermatitis.  - Skincare Routine:   - Cleansing and Moisturizing: Opt for gentle products. We recommend La Roche-Posay gel cleansers and moisturizers, samples of which have been provided.   - Sunscreen: This is vital for managing melasma. Apply it in the morning and make sure to reapply it midday.  - Lifestyle Adjustments:   - For the initial two months of using tretinoin, avoid using salicylic acid and handmade soaps.  - Follow-Up: We have scheduled a follow-up appointment in three months to monitor your progress and make any necessary adjustments to your treatment plan.  - Additional Notes:   - Lipoma Management: Should the appearance of your lipoma become a concern, we can consider surgical removal, though it's important to note the potential for scarring and recurrence.  Please do not hesitate to reach out if you have any questions or concerns before our next meeting.  Warm regards,  Dr. Langston Reusing, Dermatologist     Due to recent changes in healthcare laws, you may see results of your pathology and/or laboratory studies on MyChart before the doctors have had a chance to review them. We understand that in some cases there may be results that are confusing or concerning to you. Please understand that not all results are received at the same time and often the doctors may need to interpret multiple results in order to provide  you with the best plan of care or course of treatment. Therefore, we ask that you please give [place] 2 business days to thoroughly review all your results before contacting the office for clarification. Should we see a critical lab result, you will be contacted sooner.   If You Need Anything After Your Visit  If you have any questions or concerns for your doctor, please call our main line at [phone] If no one answers, please leave a voicemail as directed and we will return your call as soon as possible. Messages left after 4 pm will be answered the following business day.   You may also send [place] a message via MyChart. We typically respond to MyChart messages within 1-2 business days.  For prescription refills, please ask your pharmacy to contact our office. Our fax number is [phone].  If you have an urgent issue when the clinic is closed that cannot wait until the next business day, you can page your doctor at the number below.    Please note that while we do our best to be available for urgent issues outside of office hours, we are not available 24/7.   If you have an urgent issue and are unable to reach [place], you may choose to seek medical care at your doctor's office, retail clinic, urgent care center, or emergency room.  If you have a medical emergency, please immediately call 911 or go to the emergency department. In the event of inclement weather, please call our main line at [phone] for an update  on the status of any delays or closures.  Dermatology Medication Tips: Please keep the boxes that topical medications come in in order to help keep track of the instructions about where and how to use these. Pharmacies typically print the medication instructions only on the boxes and not directly on the medication tubes.   If your medication is too expensive, please contact our office at [phone] or send [place] a message through MyChart.   We are unable to tell what your co-pay for  medications will be in advance as this is different depending on your insurance coverage. However, we may be able to find a substitute medication at lower cost or fill out paperwork to get insurance to cover a needed medication.   If a prior authorization is required to get your medication covered by your insurance company, please allow [place] 1-2 business days to complete this process.  Drug prices often vary depending on where the prescription is filled and some pharmacies may offer cheaper prices.  The website www.goodrx.com contains coupons for medications through different pharmacies. The prices here do not account for what the cost may be with help from insurance (it may be cheaper with your insurance), but the website can give you the price if you did not use any insurance.  - You can print the associated coupon and take it with your prescription to the pharmacy.  - You may also stop by our office during regular business hours and pick up a GoodRx coupon card.  - If you need your prescription sent electronically to a different pharmacy, notify our office through [facility] or by phone at [phone]

## 2023-02-19 ENCOUNTER — Ambulatory Visit: Payer: BC Managed Care – PPO | Admitting: Dermatology

## 2023-03-10 ENCOUNTER — Encounter: Payer: Self-pay | Admitting: Dermatology

## 2023-04-22 ENCOUNTER — Encounter: Payer: Self-pay | Admitting: Dermatology

## 2023-05-25 ENCOUNTER — Ambulatory Visit: Payer: BC Managed Care – PPO | Admitting: Dermatology

## 2023-05-25 ENCOUNTER — Encounter: Payer: Self-pay | Admitting: Dermatology

## 2023-05-25 DIAGNOSIS — L811 Chloasma: Secondary | ICD-10-CM | POA: Diagnosis not present

## 2023-05-25 DIAGNOSIS — L219 Seborrheic dermatitis, unspecified: Secondary | ICD-10-CM | POA: Diagnosis not present

## 2023-05-25 NOTE — Progress Notes (Signed)
   Follow-Up Visit   Subjective  Erin [name] is a 57 y.o. female who presents for the following: Melasma - Seb Derm  Patient present today for follow up visit for follow up. Patient was last evaluated on 02/18/23. She was Rx Melaxemic cream from [facility] and Nystatin-Triamcinolone that she has been using. She stated that she has not had Seb Derm since her last visit. Patient reports sxs are better. Patient denies medication changes.  The following portions of the chart were reviewed this encounter and updated as appropriate: medications, allergies, medical history  Review of Systems:  No other skin or systemic complaints except as noted in HPI or Assessment and Plan.  Objective  Well appearing patient in no apparent distress; mood and affect are within normal limits.   A focused examination was performed of the following areas:   Relevant exam findings are noted in the Assessment and Plan.          Assessment & Plan   SEBORRHEIC DERMATITIS Exam: Pink patches with greasy scale at posterior bilateral ears have resolved  stable  Seborrheic Dermatitis is a chronic persistent rash characterized by pinkness and scaling most commonly of the mid face but also can occur on the scalp (dandruff), ears; mid chest, mid back and groin.  It tends to be exacerbated by stress and cooler weather.  People who have neurologic disease may experience new onset or exacerbation of existing seborrheic dermatitis.  The condition is not curable but treatable and can be controlled.  Treatment Plan: - Continue using Nystatin-Triamcinolone cream on flares. Reminded patient to use 2 weeks, then stop for 2 weeks. Repeating in necessary.   MELASMA Exam: reticulated hyperpigmented patches at face  Improved  Melasma is a chronic; persistent condition of hyperpigmented patches generally on the face, worse in summer due to higher UV exposure.    Heredity; thyroid disease; sun exposure; pregnancy; birth  control pills; epilepsy medication and darker skin may predispose to Melasma.    - Assessment: Improvement in patient's melasma since the last visit. Currently using transaminic acid cream by [facility] once a day.  - Plan: Increase the frequency of transaminic acid cream application to twice a day . Obtain updated photographs of the patient's cheeks to monitor progression.  Instruct the patient to exfoliate once a week using Dr. Jim Desanctis peeling pads (low strength, light blue) at night, excluding nights when MedRock cream is applied.  Continue daily use of sunscreen (Shiseido or stick one).    No follow-ups on file.    Documentation: I have reviewed the above documentation for accuracy and completeness, and I agree with the above.   I, Shirron Marcha Solders, CMA, am acting as scribe for [employer], DO.   Langston Reusing, DO

## 2023-05-25 NOTE — Patient Instructions (Addendum)
Hello Erin [name],  Thank you for visiting [place] again. We are pleased with the progress you've made in your skin health journey. Below is a summary of the key instructions from today's consultation:  - Nystatin-Triamcinolone Cream: Continue using for seborrheic dermatitis as needed. Be cautious to avoid overuse.  - MedRock Transaminic Acid Cream: Increase application to twice daily for melasma treatment.  - Dr. Jim Desanctis Peeling Pads: Start using once a week at night. Opt for the low strength, light blue variant available at [facility].  - Sunscreen Application: Apply sunscreen daily.   - Vichy 15% L-Ascorbic Acid Serum: Incorporate into your morning routine post-face wash and before applying MedRock cream and sunscreen. This serum can be found at [facility], [employer], or [employer].  - Progress Monitoring: We have taken updated photos of your cheeks to closely monitor the progression of your treatment.  - Follow-Up Appointment: We have scheduled your next appointment in 4 months to assess improvements and make any necessary adjustments to your treatment plan.  We've included pictures of the recommended products in your notes for easy reference. Should you have any questions about your skincare routine or require further assistance, please feel free to reach out to our office.  Wishing you a wonderful rest of the day and eagerly anticipating our next meeting.  Warm regards,  Dr. Langston Reusing, Dermatology                Important Information  Due to recent changes in healthcare laws, you may see results of your pathology and/or laboratory studies on MyChart before the doctors have had a chance to review them. We understand that in some cases there may be results that are confusing or concerning to you. Please understand that not all results are received at the same time and often the doctors may need to interpret multiple results in order to provide you with the best plan of care or course  of treatment. Therefore, we ask that you please give [place] 2 business days to thoroughly review all your results before contacting the office for clarification. Should we see a critical lab result, you will be contacted sooner.   If You Need Anything After Your Visit  If you have any questions or concerns for your doctor, please call our main line at [phone] If no one answers, please leave a voicemail as directed and we will return your call as soon as possible. Messages left after 4 pm will be answered the following business day.   You may also send [place] a message via MyChart. We typically respond to MyChart messages within 1-2 business days.  For prescription refills, please ask your pharmacy to contact our office. Our fax number is [phone].  If you have an urgent issue when the clinic is closed that cannot wait until the next business day, you can page your doctor at the number below.    Please note that while we do our best to be available for urgent issues outside of office hours, we are not available 24/7.   If you have an urgent issue and are unable to reach [place], you may choose to seek medical care at your doctor's office, retail clinic, urgent care center, or emergency room.  If you have a medical emergency, please immediately call 911 or go to the emergency department. In the event of inclement weather, please call our main line at [phone] for an update on the status of any delays or closures.  Dermatology Medication Tips: Please keep  the boxes that topical medications come in in order to help keep track of the instructions about where and how to use these. Pharmacies typically print the medication instructions only on the boxes and not directly on the medication tubes.   If your medication is too expensive, please contact our office at [phone] or send [place] a message through MyChart.   We are unable to tell what your co-pay for medications will be in advance as this is  different depending on your insurance coverage. However, we may be able to find a substitute medication at lower cost or fill out paperwork to get insurance to cover a needed medication.   If a prior authorization is required to get your medication covered by your insurance company, please allow [place] 1-2 business days to complete this process.  Drug prices often vary depending on where the prescription is filled and some pharmacies may offer cheaper prices.  The website www.goodrx.com contains coupons for medications through different pharmacies. The prices here do not account for what the cost may be with help from insurance (it may be cheaper with your insurance), but the website can give you the price if you did not use any insurance.  - You can print the associated coupon and take it with your prescription to the pharmacy.  - You may also stop by our office during regular business hours and pick up a GoodRx coupon card.  - If you need your prescription sent electronically to a different pharmacy, notify our office through [facility] or by phone at [phone]

## 2023-06-16 ENCOUNTER — Encounter: Payer: Self-pay | Admitting: Dermatology

## 2023-06-18 NOTE — Telephone Encounter (Signed)
Please inform patient that she can refill the Melaxemic cream with [facility] and continue using until her next follow up appointment.  Thanks!

## 2023-06-29 MED ORDER — SAFETY SEAL MISCELLANEOUS MISC: 1.0000 | Freq: Every evening | 2 refills | Status: DC

## 2023-06-29 NOTE — Telephone Encounter (Signed)
Please send a refill and let patient know that she's already on the highest strength.  We can change treatment regimen at her next follow up apt if she'd like but regimens can't be changed for online communication.  Thanks

## 2023-07-03 ENCOUNTER — Other Ambulatory Visit (HOSPITAL_BASED_OUTPATIENT_CLINIC_OR_DEPARTMENT_OTHER): Payer: Self-pay | Admitting: Obstetrics & Gynecology

## 2023-07-03 DIAGNOSIS — Z79890 Hormone replacement therapy (postmenopausal): Secondary | ICD-10-CM

## 2023-07-28 ENCOUNTER — Other Ambulatory Visit: Payer: Self-pay | Admitting: Obstetrics & Gynecology

## 2023-07-28 DIAGNOSIS — Z1231 Encounter for screening mammogram for malignant neoplasm of breast: Secondary | ICD-10-CM

## 2023-08-24 ENCOUNTER — Ambulatory Visit
Admission: RE | Admit: 2023-08-24 | Discharge: 2023-08-24 | Disposition: A | Discharge status: Home / Self Care | Payer: 59 | Source: Ambulatory Visit

## 2023-08-24 DIAGNOSIS — Z1231 Encounter for screening mammogram for malignant neoplasm of breast: Secondary | ICD-10-CM

## 2023-09-03 ENCOUNTER — Encounter: Payer: Self-pay | Admitting: Dermatology

## 2023-09-21 ENCOUNTER — Encounter: Payer: Self-pay | Admitting: Dermatology

## 2023-09-21 ENCOUNTER — Ambulatory Visit: Payer: BC Managed Care – PPO | Admitting: Dermatology

## 2023-09-21 VITALS — BP 121/80 | HR 85

## 2023-09-21 DIAGNOSIS — L219 Seborrheic dermatitis, unspecified: Secondary | ICD-10-CM

## 2023-09-21 DIAGNOSIS — L811 Chloasma: Secondary | ICD-10-CM | POA: Diagnosis not present

## 2023-09-21 NOTE — Progress Notes (Signed)
   Follow-Up Visit   Subjective  Erin [name] is a 58 y.o. female who presents for the following: Seb Derm And Melasma  Patient present today for follow up visit for  Seb Derm And Melasma. Patient was last evaluated on 05/25/23. At this visit patient was instructed to Increase the frequency of transaminic acid cream application to twice a day and  Instructed the patient to exfoliate once a week using Dr. Jim Desanctis peeling pads (low strength, light blue) at night, excluding nights when MedRock cream is applied. She was also recommended to continue daily use of sunscree. Patient reports sxs are unchanged. Patient denies medication changes.  The following portions of the chart were reviewed this encounter and updated as appropriate: medications, allergies, medical history  Review of Systems:  No other skin or systemic complaints except as noted in HPI or Assessment and Plan.  Objective  Well appearing patient in no apparent distress; mood and affect are within normal limits.  A focused examination was performed of the following areas: Face  Relevant exam findings are noted in the Assessment and Plan.           Assessment & Plan   MELASMA Exam: Reticulated hyperpigmented patches at face  Not at goal  Melasma is a chronic; persistent condition of hyperpigmented patches generally on the face, worse in summer due to higher UV exposure.    Heredity; thyroid disease; sun exposure; pregnancy; birth control pills; epilepsy medication and darker skin may predispose to Melasma.    - Assessment: Patient has been using medroxyprogesterone and tranexamic acid twice daily for hyperpigmentation. Improvements have been noted, but contrast remains due to lightening of natural skin color around dark spots. With the approaching spring-summer season, increased sun protection is necessary  Treatment Plan:     Discontinue medrock TXA cream    Continue tranexamic acid twice daily    Initiate  Eucerin Radiant Tone Dual Action Serum (also known as Anti-Pigment in [place]) twice daily    Consider adding La Roche-Posay Mela B3 containing niacinamide    Use tinted sunscreen with iron oxide (samples provided)    Samples of sunscreen with niacinamide provided    Maintain diligent sunscreen use    Follow up in 4 months to assess progress    Contact via MyChart for any questions   SEBORRHEIC DERMATITIS Exam: Pink patches with greasy scale at B/L Ears  Well controlled  Seborrheic Dermatitis is a chronic persistent rash characterized by pinkness and scaling most commonly of the mid face but also can occur on the scalp (dandruff), ears; mid chest, mid back and groin.  It tends to be exacerbated by stress and cooler weather.  People who have neurologic disease may experience new onset or exacerbation of existing seborrheic dermatitis.  The condition is not curable but treatable and can be controlled.  Treatment Plan: -  Currently well controlled, recommended keeping Nystatin-Triamcinolone cream on flares. Reminded patient to use 2 weeks, then stop for 2 weeks. Repeating if necessary    Return in 4 months (on 01/21/2024) for Seborrheic Dermatitis & Melasma F/U.  Documentation: I have reviewed the above documentation for accuracy and completeness, and I agree with the above.  Stasia Cavalier, am acting as scribe for Langston Reusing, DO.  Langston Reusing, DO

## 2023-09-21 NOTE — Patient Instructions (Addendum)
 Hello Quinne,  Thank you for visiting my office today.    Here's a summary of the key instructions we discussed:  - Discontinue MedRock lightening compound.  - Continue diligent use of sunscreen, especially with the approaching spring and summer months.  - Try the new product, Eucerin's Radiant Tone (also known as Anti-Pigment in [place]), available at [facility], [facility], and [employer].   - Use it twice daily as it inhibits tyrosinase   - Effective for pigment production without causing sun sensitivity  - Consider Mela B3 Daily sunscreen by Michaelle Birks, which contains niacinamide, beneficial for hydration and pigment production.  - Use tinted sunscreens with iron oxide to protect against blue light and visible light; brands like Neutrogena and Tizo are recommended.  - We have provided samples of sunscreen with niacinamide and tinted sunscreens for you to try.  - Remember, it may take up to three months to see noticeable changes in your skin.  - If you have any questions or need further assistance, please send a message via MyChart.  Thank you for trusting me with your skincare needs. Looking forward to seeing the progress at your next visit.  Warm regards,  Dr. Langston Reusing, Dermatology         Important Information   Due to recent changes in healthcare laws, you may see results of your pathology and/or laboratory studies on MyChart before the doctors have had a chance to review them. We understand that in some cases there may be results that are confusing or concerning to you. Please understand that not all results are received at the same time and often the doctors may need to interpret multiple results in order to provide you with the best plan of care or course of treatment. Therefore, we ask that you please give [place] 2 business days to thoroughly review all your results before contacting the office for clarification. Should we see a critical lab result, you will be contacted  sooner.     If You Need Anything After Your Visit   If you have any questions or concerns for your doctor, please call our main line at [phone]. If no one answers, please leave a voicemail as directed and we will return your call as soon as possible. Messages left after 4 pm will be answered the following business day.    You may also send [place] a message via MyChart. We typically respond to MyChart messages within 1-2 business days.  For prescription refills, please ask your pharmacy to contact our office. Our fax number is [phone].  If you have an urgent issue when the clinic is closed that cannot wait until the next business day, you can page your doctor at the number below.     Please note that while we do our best to be available for urgent issues outside of office hours, we are not available 24/7.    If you have an urgent issue and are unable to reach [place], you may choose to seek medical care at your doctor's office, retail clinic, urgent care center, or emergency room.   If you have a medical emergency, please immediately call 911 or go to the emergency department. In the event of inclement weather, please call our main line at [phone] for an update on the status of any delays or closures.  Dermatology Medication Tips: Please keep the boxes that topical medications come in in order to help keep track of the instructions about where and how to use these.  Pharmacies typically print the medication instructions only on the boxes and not directly on the medication tubes.   If your medication is too expensive, please contact our office at [phone] or send [place] a message through MyChart.    We are unable to tell what your co-pay for medications will be in advance as this is different depending on your insurance coverage. However, we may be able to find a substitute medication at lower cost or fill out paperwork to get insurance to cover a needed medication.    If a prior  authorization is required to get your medication covered by your insurance company, please allow [place] 1-2 business days to complete this process.   Drug prices often vary depending on where the prescription is filled and some pharmacies may offer cheaper prices.   The website www.goodrx.com contains coupons for medications through different pharmacies. The prices here do not account for what the cost may be with help from insurance (it may be cheaper with your insurance), but the website can give you the price if you did not use any insurance.  - You can print the associated coupon and take it with your prescription to the pharmacy.  - You may also stop by our office during regular business hours and pick up a GoodRx coupon card.  - If you need your prescription sent electronically to a different pharmacy, notify our office through [facility] or by phone at [phone]

## 2023-09-30 ENCOUNTER — Encounter: Payer: Self-pay | Admitting: Dermatology

## 2023-09-30 NOTE — Telephone Encounter (Signed)
 She can continue use those products

## 2023-11-02 ENCOUNTER — Ambulatory Visit (HOSPITAL_BASED_OUTPATIENT_CLINIC_OR_DEPARTMENT_OTHER): Payer: BC Managed Care – PPO | Admitting: Obstetrics & Gynecology

## 2023-11-02 ENCOUNTER — Encounter (HOSPITAL_BASED_OUTPATIENT_CLINIC_OR_DEPARTMENT_OTHER): Payer: Self-pay | Admitting: Obstetrics & Gynecology

## 2023-11-02 VITALS — BP 165/88 | HR 87 | Ht 64.5 in | Wt 162.8 lb

## 2023-11-02 DIAGNOSIS — N950 Postmenopausal bleeding: Secondary | ICD-10-CM | POA: Diagnosis not present

## 2023-11-02 DIAGNOSIS — Z01419 Encounter for gynecological examination (general) (routine) without abnormal findings: Secondary | ICD-10-CM

## 2023-11-02 DIAGNOSIS — Z79890 Hormone replacement therapy (postmenopausal): Secondary | ICD-10-CM

## 2023-11-02 DIAGNOSIS — Z9229 Personal history of other drug therapy: Secondary | ICD-10-CM

## 2023-11-02 DIAGNOSIS — F32A Depression, unspecified: Secondary | ICD-10-CM

## 2023-11-02 DIAGNOSIS — E7800 Pure hypercholesterolemia, unspecified: Secondary | ICD-10-CM

## 2023-11-02 DIAGNOSIS — R030 Elevated blood-pressure reading, without diagnosis of hypertension: Secondary | ICD-10-CM | POA: Diagnosis not present

## 2023-11-02 MED ORDER — ESTRADIOL 0.05 MG/24HR TD PTTW: 1.0000 | MEDICATED_PATCH | TRANSDERMAL | 3 refills | Status: AC

## 2023-11-02 MED ORDER — PROGESTERONE 200 MG PO CAPS: ORAL_CAPSULE | ORAL | 4 refills | Status: AC

## 2023-11-02 MED ORDER — ESCITALOPRAM OXALATE 20 MG PO TABS: 20.0000 mg | ORAL_TABLET | Freq: Every day | ORAL | 3 refills | Status: DC

## 2023-11-02 NOTE — Progress Notes (Signed)
 ANNUAL EXAM Patient name: Erin [name] MRN [id number]  Date of birth: Aug 22, 1965 Chief Complaint:   Annual Exam  History of Present Illness:   Erin [name] is a 58 y.o. G1P0 African-American female being seen today for a routine annual exam.  Having some increase anxiety that she notes during the morning that dose seem to dissipate during the day.  Doesn't feel like she has very much motivation.  Not sure if related to menopause or mood.  Is on testosterone pellets with [facility].  Does send lab work.  Also on estradiol  patch and Prometrium  days 1-14 each month.  Having regular monthly bleeding, sometimes heavy.  Last ultrasound was in 2014 where fibroids were noted.  Discuss proceeding with some evaluation this year.  She is comfortable with this.  Patient's last menstrual period was 10/10/2023 (approximate).     Last pap 10/27/2022. Results were: NILM w/ HRHPV negative.  Last mammogram: 08/26/2023. Results were: normal. Family h/o breast cancer: no Last colonoscopy: 10/10/2021.  Negative.  Repeat next year.     11/02/2023    9:20 AM 10/27/2022    1:19 PM 10/03/2021    9:46 AM 07/25/2021    8:24 AM  Depression screen PHQ 2/9  Decreased Interest 0 0 0 2  Down, Depressed, Hopeless 0 0 0 1  PHQ - 2 Score 0 0 0 3  Altered sleeping    1  Tired, decreased energy    0  Change in appetite    0  Feeling bad or failure about yourself     0  Trouble concentrating    1  Moving slowly or fidgety/restless    0  Suicidal thoughts    1  PHQ-9 Score    6  Difficult doing work/chores    Somewhat difficult    Review of Systems:   Pertinent items are noted in HPI Denies any headaches, blurred vision, fatigue, shortness of breath, chest pain, abdominal pain, abnormal vaginal discharge/itching/odor/irritation, problems with periods, bowel movements, urination, or intercourse unless otherwise stated above. Pertinent History Reviewed:  Reviewed past medical,surgical, social and family history.   Reviewed problem list, medications and allergies. Physical Assessment:   Vitals:   11/02/23 0914 11/02/23 0957  BP: (!) 151/103 (!) 165/88  Pulse: 87   Weight: 162 lb 12.8 oz (73.8 kg)   Height: 5' 4.5" (1.638 m)   Body mass index is 27.51 kg/m.        Physical Examination:   General appearance - well appearing, and in no distress  Mental status - alert, oriented to person, place, and time  Psych:  She has a normal mood and affect  Skin - warm and dry, normal color, no suspicious lesions noted  Chest - effort normal, all lung fields clear to auscultation bilaterally  Heart - normal rate and regular rhythm  Neck:  midline trachea, no thyromegaly or nodules  Breasts - breasts appear normal, no suspicious masses, no skin or nipple changes or  axillary nodes  Abdomen - soft, nontender, nondistended, no masses or organomegaly  Pelvic - VULVA: normal appearing vulva with no masses, tenderness or lesions  VAGINA: normal appearing vagina with normal color and discharge, no lesions  CERVIX: normal appearing cervix without discharge or lesions, no CMT  Thin prep pap is not done   UTERUS: about 8 weeks, globular, mobile  ADNEXA: No adnexal masses or tenderness noted.  Rectal - normal rectal, good sphincter tone, no masses felt.   Extremities:  No swelling or varicosities noted  Chaperone present for exam  Assessment & Plan:  1. Well woman exam with routine gynecological exam (Primary) - Pap smear 10/27/2022 neg with neg HR HPV - Mammogram 08/26/2023 - Colonoscopy 10/10/2021 - Bone mineral density not indicated - lab work done as per below - vaccines reviewed/updated  2. White coat syndrome without diagnosis of hypertension  3. Hormone replacement therapy (HRT) - estradiol  (VIVELLE -DOT) 0.05 MG/24HR patch; Place 1 patch (0.05 mg total) onto the skin 2 (two) times a week.  Dispense: 24 patch; Refill: 3 - progesterone  (PROMETRIUM ) 200 MG capsule; Take 1 capsule daily days 1-14 each month   Dispense: 42 capsule; Refill: 4  4. Postmenopausal bleeding - US  PELVIS TRANSVAGINAL NON-OB (TV ONLY); Future  5. Mild depression - escitalopram  (LEXAPRO ) 20 MG tablet; Take 1 tablet (20 mg total) by mouth daily.  Dispense: 90 tablet; Refill: 3 - Advised to switch time of day she is taking this.  Will see if need to add additional treatment.  6. Elevated LDL cholesterol level - Lipid panel - Comprehensive metabolic panel with GFR - Hemoglobin A1c    Meds:  Meds ordered this encounter  Medications   estradiol  (VIVELLE -DOT) 0.05 MG/24HR patch    Sig: Place 1 patch (0.05 mg total) onto the skin 2 (two) times a week.    Dispense:  24 patch    Refill:  3    ZERO refills remain on this prescription. Your patient is requesting advance approval of refills for this medication to PREVENT ANY MISSED DOSES   progesterone  (PROMETRIUM ) 200 MG capsule    Sig: Take 1 capsule daily days 1-14 each month    Dispense:  42 capsule    Refill:  4   escitalopram  (LEXAPRO ) 20 MG tablet    Sig: Take 1 tablet (20 mg total) by mouth daily.    Dispense:  90 tablet    Refill:  3    Follow-up: Return in about 2 weeks (around 11/16/2023) for for pelvic ultrasound, possible endometrial biopsy.  Lillian Rein, MD 11/05/2023 11:19 PM

## 2023-11-03 ENCOUNTER — Encounter (HOSPITAL_BASED_OUTPATIENT_CLINIC_OR_DEPARTMENT_OTHER): Payer: Self-pay | Admitting: Obstetrics & Gynecology

## 2023-11-03 LAB — COMPREHENSIVE METABOLIC PANEL WITH GFR
ALT: 14 IU/L (ref 0–32)
AST: 13 IU/L (ref 0–40)
Albumin: 4.4 g/dL (ref 3.8–4.9)
Alkaline Phosphatase: 73 IU/L (ref 44–121)
BUN/Creatinine Ratio: 9 (ref 9–23)
BUN: 8 mg/dL (ref 6–24)
Bilirubin Total: 0.4 mg/dL (ref 0.0–1.2)
CO2: 22 mmol/L (ref 20–29)
Calcium: 9.4 mg/dL (ref 8.7–10.2)
Chloride: 101 mmol/L (ref 96–106)
Creatinine, Ser: 0.85 mg/dL (ref 0.57–1.00)
Globulin, Total: 2.9 g/dL (ref 1.5–4.5)
Glucose: 101 mg/dL — ABNORMAL HIGH (ref 70–99)
Potassium: 4 mmol/L (ref 3.5–5.2)
Sodium: 138 mmol/L (ref 134–144)
Total Protein: 7.3 g/dL (ref 6.0–8.5)
eGFR: 80 mL/min/{1.73_m2} (ref >59)

## 2023-11-03 LAB — LIPID PANEL
Chol/HDL Ratio: 3.5 ratio (ref 0.0–4.4)
Cholesterol, Total: 186 mg/dL (ref 100–199)
HDL: 53 mg/dL (ref >39)
LDL Chol Calc (NIH): 120 mg/dL — ABNORMAL HIGH (ref 0–99)
Triglycerides: 67 mg/dL (ref 0–149)
VLDL Cholesterol Cal: 13 mg/dL (ref 5–40)

## 2023-11-03 LAB — HEMOGLOBIN A1C
Est. average glucose Bld gHb Est-mCnc: 103 mg/dL
Hgb A1c MFr Bld: 5.2 % (ref 4.8–5.6)

## 2023-11-05 DIAGNOSIS — Z79890 Hormone replacement therapy (postmenopausal): Secondary | ICD-10-CM | POA: Insufficient documentation

## 2023-11-11 ENCOUNTER — Ambulatory Visit: Payer: Self-pay

## 2023-11-11 ENCOUNTER — Ambulatory Visit: Payer: Self-pay | Admitting: Obstetrics & Gynecology

## 2023-11-11 ENCOUNTER — Ambulatory Visit (HOSPITAL_BASED_OUTPATIENT_CLINIC_OR_DEPARTMENT_OTHER)

## 2023-11-11 ENCOUNTER — Ambulatory Visit (HOSPITAL_BASED_OUTPATIENT_CLINIC_OR_DEPARTMENT_OTHER): Admitting: Obstetrics & Gynecology

## 2023-11-11 ENCOUNTER — Encounter (HOSPITAL_BASED_OUTPATIENT_CLINIC_OR_DEPARTMENT_OTHER): Payer: Self-pay | Admitting: Obstetrics & Gynecology

## 2023-11-11 VITALS — BP 170/88 | HR 128 | Ht 64.5 in | Wt 160.0 lb

## 2023-11-11 DIAGNOSIS — D259 Leiomyoma of uterus, unspecified: Secondary | ICD-10-CM | POA: Diagnosis not present

## 2023-11-11 DIAGNOSIS — N950 Postmenopausal bleeding: Secondary | ICD-10-CM | POA: Diagnosis not present

## 2023-11-11 DIAGNOSIS — D251 Intramural leiomyoma of uterus: Secondary | ICD-10-CM | POA: Diagnosis not present

## 2023-11-11 DIAGNOSIS — Z79890 Hormone replacement therapy (postmenopausal): Secondary | ICD-10-CM | POA: Diagnosis not present

## 2023-11-11 NOTE — Telephone Encounter (Signed)
 Copied from CRM [id number]. Topic: Clinical - Red Word Triage >> Nov 11, 2023  4:19 PM [place] B wrote: [facility] that prompted transfer to Nurse Triage: pt bp is high

## 2023-11-11 NOTE — Telephone Encounter (Signed)
 Copied from CRM [id number]. Topic: Clinical - Red Word Triage >> Nov 11, 2023  4:19 PM [place] B wrote: [facility] that prompted transfer to Nurse Triage: pt bp is high   Chief Complaint: High blood pressure  Symptoms: Elevated blood pressure  Frequency: Intermittent Pertinent Negatives: Patient denies headache, blurred vision, dizziness  Disposition: [] ED /[] Urgent Care (no appt availability in office) / [x] Appointment(In office/virtual)/ []  [place]/ [] Home Care/ [] Refused Recommended Disposition /[] [place] [facility]/ []  Follow-up with PCP Additional Notes: Patient calling to set up a new patient appointment due to recent high blood pressure reading. Patient was at an appointment today and her blood pressure was 143/94 and she was advised to follow up with a PCP and was given Trula Gable Caudle's name. Patient has not symptoms with her high blood pressures. Appointment made and patient instructed to call back for new or worsening symptoms. Patient verbalized understanding and agreement with this plan.     Reason for Disposition  [1] Systolic BP  >= 130 OR Diastolic >= 80 AND [2] not taking BP medications    No symptoms, new patient appointment made for preferred provider  Answer Assessment - Initial Assessment Questions 1. BLOOD PRESSURE: "What is the blood pressure?" "Did you take at least two measurements 5 minutes apart?"     143/94 2. ONSET: "When did you take your blood pressure?"     Today 3. HOW: "How did you take your blood pressure?" (e.g., automatic home BP monitor, visiting nurse)      At a physicians office  4. HISTORY: "Do you have a history of high blood pressure?"     No 5. MEDICINES: "Are you taking any medicines for blood pressure?" "Have you missed any doses recently?"     No 6. OTHER SYMPTOMS: "Do you have any symptoms?" (e.g., blurred vision, chest pain, difficulty breathing, headache, weakness)     No symptoms  Protocols used: Blood Pressure -  High-A-AH

## 2023-11-14 NOTE — Progress Notes (Signed)
 GYNECOLOGY  VISIT  CC:   Discuss ultrasound results, vaginal bleeing  HPI: 58 y.o. G1P0 Single Black or [place] American female here for discussion of ultrasound results.  Ultrasound obtained due to h/o fibroids and menstrual bleeding.  Bleeding does occur after taking progesterone .  At last visit, I understood bleeding to be heavy with clots each month.  This would be a change from the past but today we discussed this and she mostly has cycles that last 2-3 days and are light.  Imaging does show fibroids but compared to prior ultrasound on 03/10/2013 and fibroids are still present but fairly stable since that time.  No additional evaluation recommended at this time.     Past Medical History:  Diagnosis Date   Anxiety    Gonorrhea 1988   Headache    menstrual   Sickle cell trait (HCC)        Vaginal Pap smear, abnormal    White coat syndrome without diagnosis of hypertension 09/07/2018    MEDS:   Current Outpatient Medications on File Prior to Visit  Medication Sig Dispense Refill   escitalopram  (LEXAPRO ) 20 MG tablet Take 1 tablet (20 mg total) by mouth daily. 90 tablet 3   estradiol  (VIVELLE -DOT) 0.05 MG/24HR patch Place 1 patch (0.05 mg total) onto the skin 2 (two) times a week. 24 patch 3   fish oil-omega-3 fatty acids 1000 MG capsule Take 1 g by mouth daily.     Multiple Vitamin (MULTIVITAMIN) tablet Take 1 tablet by mouth daily.     NON FORMULARY Testosterone pellet 137.5 mg     progesterone  (PROMETRIUM ) 200 MG capsule Take 1 capsule daily days 1-14 each month 42 capsule 4   VYZULTA 0.024 % SOLN Place 1 drop into the right eye at bedtime.     No current facility-administered medications on file prior to visit.    ALLERGIES: Prozac [fluoxetine] and Sulfamethoxazole  SH:  single, non smoker  Review of Systems  Constitutional: Negative.   Genitourinary: Negative.     PHYSICAL EXAMINATION:    BP (!) 170/88   Pulse (!) 128   Ht 5' 4.5" (1.638 m)   Wt 160 lb (72.6 kg)    LMP 10/10/2023 (Approximate)   BMI 27.04 kg/m      Physical Exam Constitutional:      Appearance: Normal appearance.  Neurological:     General: No focal deficit present.  Psychiatric:        Mood and Affect: Mood normal.    Assessment/Plan: 1. Intramural leiomyoma of uterus (Primary) - given stable ultrasound and better understanding of menstrual bleeding, feel ok to monitor.  If bleeding becomes heavies with clotting, advised pt to reach out and let me know.  Would proceed with endometrial biopsy at that time.

## 2023-12-07 ENCOUNTER — Encounter (HOSPITAL_BASED_OUTPATIENT_CLINIC_OR_DEPARTMENT_OTHER): Payer: Self-pay | Admitting: Obstetrics & Gynecology

## 2024-01-04 ENCOUNTER — Telehealth (INDEPENDENT_AMBULATORY_CARE_PROVIDER_SITE_OTHER): Admitting: Adult Health

## 2024-01-04 ENCOUNTER — Encounter: Payer: Self-pay | Admitting: Adult Health

## 2024-01-04 ENCOUNTER — Telehealth: Payer: Self-pay | Admitting: Adult Health

## 2024-01-04 VITALS — Ht 64.5 in | Wt 160.0 lb

## 2024-01-04 DIAGNOSIS — F331 Major depressive disorder, recurrent, moderate: Secondary | ICD-10-CM

## 2024-01-04 DIAGNOSIS — F411 Generalized anxiety disorder: Secondary | ICD-10-CM

## 2024-01-04 MED ORDER — BUPROPION HCL ER (XL) 150 MG PO TB24: 150.0000 mg | ORAL_TABLET | Freq: Every day | ORAL | 2 refills | Status: DC

## 2024-01-04 NOTE — Progress Notes (Addendum)
 [facility] MD/PA/NP Initial Note  Virtual Visit via Video Note  I connected with pt @ on 01/04/24 at 10:30 AM EDT by a video enabled telemedicine application and verified that I am speaking with the correct person using two identifiers.   I discussed the limitations of evaluation and management by telemedicine and the availability of in person appointments. The patient expressed understanding and agreed to proceed.  I discussed the assessment and treatment plan with the patient. The patient was provided an opportunity to ask questions and all were answered. The patient agreed with the plan and demonstrated an understanding of the instructions.   The patient was advised to call back or seek an in-person evaluation if the symptoms worsen or if the condition fails to improve as anticipated.  I provided 60 minutes of non-face-to-face time during this encounter.  The patient was located at home.  The provider was located at [facility].   Angeline LOISE Sayers, NP    01/04/2024 11:26 AM KORDELIA SEVERIN  MRN:  [id number]  Chief Complaint:   HPI:   Patient seen today for initial psychiatric evaluation.   Reports a breakdown in 2013 - forced into menopause.  Reports her GYN started her on Lexapro  in 2020 for mood symptoms.   Describes mood today as not good. Pleasant. Reports tearfulness. Mood symptoms - reports depression and anxiety. Reports interest and motivation.Denies irritability. Denies panic attacks. Reports worry, rumination and over thinking. Denies obsessive thoughts or acts. Mood is lower. I feel like a sharpened pencil worn down and want to be sharpe again. Taking medications as prescribed.  Energy levels lower. Active, has a regular exercise routine - twice weekly.   Enjoys some usual interests and activities. Single. Lives alone. Spending time with family and friends. Appetite adequate. Weight stable. Sleeps well most nights. Averages 6 hours. Focus and  concentration stable. Completing tasks. Managing aspects of household. 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  Denies SI or HI.  Denies AH or VH. Denies self harm. Denies substance use. Reports seeing a therapist in the past.   Previous medication trials: Prozac - reports did not help, Lexapro  20mg  daily approximately  Visit Diagnosis:    ICD-10-CM   1. Major depressive disorder, recurrent episode, moderate (HCC)  F33.1     2. Generalized anxiety disorder  F41.1       Past Psychiatric History: Denies psychiatric hospitalization.   Past Medical History:  Past Medical History:  Diagnosis Date   Anxiety    Gonorrhea 1988   Headache    menstrual   Sickle cell trait (HCC)        Vaginal Pap smear, abnormal    White coat syndrome without diagnosis of hypertension 09/07/2018    Past Surgical History:  Procedure Laterality Date   ABLATION SAPHENOUS VEIN W/ RFA     Right leg   BREAST BIOPSY  06/10/2011   Procedure: BREAST BIOPSY WITH NEEDLE LOCALIZATION;  Surgeon: Donnice POUR. Belinda, MD;  Location: [facility] OR;  Service: General;  Laterality: Right;  Right needle localized lumpectomy   BREAST BIOPSY Right 05/2011   BREAST EXCISIONAL BIOPSY Right 06/10/2011   MYOMECTOMY N/A 06/27/2013   Procedure: VAGINAL MYOMECTOMY;  Surgeon: Ronal Elvie Pinal, MD;  Location: [facility];  Service: Gynecology;  Laterality: N/A;   TOE SURGERY  7/09   broken toe   WISDOM TOOTH EXTRACTION      Family Psychiatric History: Denies any family history of mental illness.   Family History:  Family History  Problem Relation Age of Onset   Hypertension Mother    Anxiety disorder Mother    Tuberculosis Father        ?/not sure   Heart disease Maternal Grandmother    Hypertension Sister     Social History:  Social History   Socioeconomic History   Marital status: Single    Spouse name: Not on file   Number of children: Not on file   Years of education: Not on file   Highest education level: Not on  file  Occupational History   Not on file  Tobacco Use   Smoking status: Never   Smokeless tobacco: Never  Vaping Use   Vaping status: Never Used  Substance and Sexual Activity   Alcohol use: Yes    Alcohol/week: 0.0 - 1.0 standard drinks of alcohol   Drug use: No   Sexual activity: Not Currently    Partners: Male    Birth control/protection: Abstinence, Post-menopausal  Other Topics Concern   Not on file  Social History Narrative   Not on file   Social Drivers of Health   Financial Resource Strain: Not on file  Food Insecurity: Not on file  Transportation Needs: Not on file  Physical Activity: Not on file  Stress: Not on file  Social Connections: Not on file    Allergies:  Allergies  Allergen Reactions   Prozac [Fluoxetine]     Suicidal thoughts and depression   Sulfamethoxazole     REACTION: hives    Metabolic Disorder Labs: Lab Results  Component Value Date   HGBA1C 5.2 11/02/2023   No results found for: PROLACTIN Lab Results  Component Value Date   CHOL 186 11/02/2023   TRIG 67 11/02/2023   HDL 53 11/02/2023   CHOLHDL 3.5 11/02/2023   VLDL 16 05/04/2015   LDLCALC 120 (H) 11/02/2023   LDLCALC 101 (H) 10/03/2021   Lab Results  Component Value Date   TSH 2.200 10/03/2021   TSH 3.225 05/04/2015    Therapeutic Level Labs: No results found for: LITHIUM No results found for: VALPROATE No results found for: CBMZ  Current Medications: Current Outpatient Medications  Medication Sig Dispense Refill   escitalopram  (LEXAPRO ) 20 MG tablet Take 1 tablet (20 mg total) by mouth daily. 90 tablet 3   estradiol  (VIVELLE -DOT) 0.05 MG/24HR patch Place 1 patch (0.05 mg total) onto the skin 2 (two) times a week. 24 patch 3   fish oil-omega-3 fatty acids 1000 MG capsule Take 1 g by mouth daily.     Multiple Vitamin (MULTIVITAMIN) tablet Take 1 tablet by mouth daily.     NON FORMULARY Testosterone pellet 137.5 mg     progesterone  (PROMETRIUM ) 200 MG capsule  Take 1 capsule daily days 1-14 each month 42 capsule 4   VYZULTA 0.024 % SOLN Place 1 drop into the right eye at bedtime.     No current facility-administered medications for this visit.    Medication Side Effects: none  Orders placed this visit:  No orders of the defined types were placed in this encounter.   Psychiatric Specialty Exam:  Review of Systems  Musculoskeletal:  Negative for gait problem.  Neurological:  Negative for tremors.  Psychiatric/Behavioral:         Please refer to HPI    There were no vitals taken for this visit.There is no height or weight on file to calculate BMI.  General Appearance: Casual and Neat  Eye Contact:  Good  Speech:  Clear and Coherent and Normal Rate  Volume:  Normal  Mood:  Depressed  Affect:  Appropriate and Congruent  Thought Process:  Coherent and Descriptions of Associations: Intact  Orientation:  Full (Time, Place, and Person)  Thought Content: Logical   Suicidal Thoughts:  No  Homicidal Thoughts:  No  Memory:  WNL  Judgement:  Good  Insight:  Good  Psychomotor Activity:  Normal  Concentration:  Concentration: Good and Attention Span: Good  Recall:  Good  Fund of Knowledge: Good  Language: Good  Assets:  Communication Skills Desire for Improvement Financial Resources/Insurance Housing Intimacy Leisure Time Physical Health Resilience Social Support Talents/Skills Transportation Vocational/Educational  ADL's:  Intact  Cognition: WNL  Prognosis:  Good   Screenings:  PHQ2-9    Flowsheet Row Office Visit from 11/11/2023 in [facility] for [employer] [facility] [employer] Office Visit from 11/02/2023 in [facility] for [employer] [facility] [employer] Office Visit from 10/27/2022 in [facility] for [facility] at [employer] Office Visit from 10/03/2021 in [facility] for [employer] at [employer] Video Visit from 07/25/2021 in [facility] for [facility]  Healthcare at [facility]  PHQ-2 Total Score 0 0 0 0 3  PHQ-9 Total Score -- -- -- -- 6   Flowsheet Row UC from 02/03/2023 in [facility] at [employer] [facility]) UC from 12/30/2022 in [facility] at [employer] [facility]) UC from 11/03/2022 in [facility] at [facility])  C-SSRS RISK CATEGORY No Risk No Risk No Risk    Receiving Psychotherapy: No   Treatment Plan/Recommendations:   Plan:  PDMP reviewed  Discussed 3 options with patient - patient will call with update:  Continue Lexapro  20mg  daily and add Buspar 10mg  TID Continue Lexapro  20mg  daily and add Wellbutrin XL 150mg  daily - denies seizure history. Will plan to speak ophthalmologist first regarding IOP.  Taper off Lexapro  and start Zoloft - instructions given.    RTC 4 weeks  60 minutes spent dedicated to the care of this patient on the date of this encounter to include pre-visit review of records, ordering of medication, post visit documentation, and face-to-face time with the patient discussing anxiety. Discussed continuing current medication regimen.  Patient advised to contact office with any questions, adverse effects, or acute worsening in signs and symptoms.    Dearies Meikle N Ky Rumple, NP

## 2024-01-04 NOTE — Telephone Encounter (Signed)
 Following up on a question from today's visit.  Wellbutrin is okay for her to take.

## 2024-01-04 NOTE — Addendum Note (Signed)
 Addended by: LAWERANCE ANGELINE SAILOR on: 01/04/2024 04:57 PM   Modules accepted: Orders

## 2024-01-20 ENCOUNTER — Encounter (HOSPITAL_BASED_OUTPATIENT_CLINIC_OR_DEPARTMENT_OTHER): Payer: Self-pay | Admitting: Obstetrics & Gynecology

## 2024-01-26 ENCOUNTER — Other Ambulatory Visit: Payer: Self-pay | Admitting: Adult Health

## 2024-01-26 ENCOUNTER — Ambulatory Visit: Admitting: Dermatology

## 2024-01-26 DIAGNOSIS — F331 Major depressive disorder, recurrent, moderate: Secondary | ICD-10-CM

## 2024-02-01 ENCOUNTER — Ambulatory Visit (HOSPITAL_BASED_OUTPATIENT_CLINIC_OR_DEPARTMENT_OTHER): Admitting: Family Medicine

## 2024-02-02 ENCOUNTER — Telehealth: Admitting: Adult Health

## 2024-02-02 ENCOUNTER — Encounter: Payer: Self-pay | Admitting: Adult Health

## 2024-02-02 DIAGNOSIS — F411 Generalized anxiety disorder: Secondary | ICD-10-CM | POA: Diagnosis not present

## 2024-02-02 DIAGNOSIS — F331 Major depressive disorder, recurrent, moderate: Secondary | ICD-10-CM | POA: Diagnosis not present

## 2024-02-02 MED ORDER — SERTRALINE HCL 100 MG PO TABS: 100.0000 mg | ORAL_TABLET | Freq: Every day | ORAL | 2 refills | Status: DC

## 2024-02-02 NOTE — Progress Notes (Signed)
 Erin [name] [id number] 06/10/1966 58 y.o.  Virtual Visit via Video Note  I connected with pt @ on 02/02/24 at  2:00 PM EDT by a video enabled telemedicine application and verified that I am speaking with the correct person using two identifiers.   I discussed the limitations of evaluation and management by telemedicine and the availability of in person appointments. The patient expressed understanding and agreed to proceed.  I discussed the assessment and treatment plan with the patient. The patient was provided an opportunity to ask questions and all were answered. The patient agreed with the plan and demonstrated an understanding of the instructions.   The patient was advised to call back or seek an in-person evaluation if the symptoms worsen or if the condition fails to improve as anticipated.  I provided 25 minutes of non-face-to-face time during this encounter.  The patient was located at home.  The provider was located at [facility].   Angeline LOISE Sayers, NP   Subjective:   Patient ID:  Erin [name] is a 58 y.o. (DOB 01/06/1966) female.  Chief Complaint: No chief complaint on file.   HPI Erin [name] presents for follow-up of MDD and GAD.  Describes mood today as about the same or a little bit better. Pleasant. Reports tearfulness - not as much, just sometimes. Mood symptoms - reports depression and anxiety sometimes. Reports lower interest and motivation. Denies irritability. Denies panic attacks. Reports worry, rumination and over thinking. Denies obsessive thoughts or acts. Reports mood is about the same. I feel about the same. Reports trying the Wellbutrin  for a week or 2 and felt it ramped her up. Taking medications as prescribed.  Energy levels lower. Active, does not have a regular exercise routine. Enjoys some usual interests and activities. Single. Lives alone. Spending time with family and friends. Appetite adequate. Weight loss 10  pounds. Sleeps well most nights. Averages 6 hours. Focus and concentration stable. Completing tasks. Managing aspects of household. 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  Denies SI or HI.  Denies AH or VH. Denies self harm. Denies substance use. Reports seeing a therapist in the past.   Previous medication trials: Prozac - reports did not help, Lexapro  20mg  daily approximately  Review of Systems:  Review of Systems  Musculoskeletal:  Negative for gait problem.  Neurological:  Negative for tremors.  Psychiatric/Behavioral:         Please refer to HPI    Medications: I have reviewed the patient's current medications.  Current Outpatient Medications  Medication Sig Dispense Refill   buPROPion  (WELLBUTRIN  XL) 150 MG 24 hr tablet Take 1 tablet (150 mg total) by mouth daily. 30 tablet 2   escitalopram  (LEXAPRO ) 20 MG tablet Take 1 tablet (20 mg total) by mouth daily. 90 tablet 3   estradiol  (VIVELLE -DOT) 0.05 MG/24HR patch Place 1 patch (0.05 mg total) onto the skin 2 (two) times a week. 24 patch 3   fish oil-omega-3 fatty acids 1000 MG capsule Take 1 g by mouth daily.     Multiple Vitamin (MULTIVITAMIN) tablet Take 1 tablet by mouth daily.     NON FORMULARY Testosterone pellet 137.5 mg     progesterone  (PROMETRIUM ) 200 MG capsule Take 1 capsule daily days 1-14 each month 42 capsule 4   VYZULTA 0.024 % SOLN Place 1 drop into the right eye at bedtime.     No current facility-administered medications for this visit.    Medication Side Effects: None  Allergies:  Allergies  Allergen Reactions   Prozac [Fluoxetine]     Suicidal thoughts and depression   Sulfamethoxazole     REACTION: hives    Past Medical History:  Diagnosis Date   Anxiety    Gonorrhea 1988   Headache    menstrual   Sickle cell trait (HCC)        Vaginal Pap smear, abnormal    White coat syndrome without diagnosis of hypertension 09/07/2018    Family History  Problem Relation Age of Onset    Hypertension Mother    Anxiety disorder Mother    Tuberculosis Father        ?/not sure   Heart disease Maternal Grandmother    Hypertension Sister     Social History   Socioeconomic History   Marital status: Single    Spouse name: Not on file   Number of children: Not on file   Years of education: Not on file   Highest education level: Not on file  Occupational History   Not on file  Tobacco Use   Smoking status: Never   Smokeless tobacco: Never  Vaping Use   Vaping status: Never Used  Substance and Sexual Activity   Alcohol use: Yes    Alcohol/week: 0.0 - 1.0 standard drinks of alcohol   Drug use: No   Sexual activity: Not Currently    Partners: Male    Birth control/protection: Abstinence, Post-menopausal  Other Topics Concern   Not on file  Social History Narrative   Not on file   Social Drivers of Health   Financial Resource Strain: Not on file  Food Insecurity: Not on file  Transportation Needs: Not on file  Physical Activity: Not on file  Stress: Not on file  Social Connections: Not on file  Intimate Partner Violence: Not on file    Past Medical History, Surgical history, Social history, and Family history were reviewed and updated as appropriate.   Please see review of systems for further details on the patient's review from today.   Objective:   Physical Exam:  There were no vitals taken for this visit.  Physical Exam Constitutional:      General: She is not in acute distress. Musculoskeletal:        General: No deformity.  Neurological:     Mental Status: She is alert and oriented to person, place, and time.     Coordination: Coordination normal.  Psychiatric:        Attention and Perception: Attention and perception normal. She does not perceive auditory or visual hallucinations.        Mood and Affect: Mood normal. Mood is not anxious or depressed. Affect is not labile, blunt, angry or inappropriate.        Speech: Speech normal.         Behavior: Behavior normal.        Thought Content: Thought content normal. Thought content is not paranoid or delusional. Thought content does not include homicidal or suicidal ideation. Thought content does not include homicidal or suicidal plan.        Cognition and Memory: Cognition and memory normal.        Judgment: Judgment normal.     Comments: Insight intact     Lab Review:     Component Value Date/Time   NA 138 11/02/2023 1039   K 4.0 11/02/2023 1039   CL 101 11/02/2023 1039   CO2 22 11/02/2023 1039   GLUCOSE 101 (H) 11/02/2023 1039  GLUCOSE 91 05/04/2015 0927   BUN 8 11/02/2023 1039   CREATININE 0.85 11/02/2023 1039   CREATININE 0.88 05/04/2015 0927   CALCIUM  9.4 11/02/2023 1039   PROT 7.3 11/02/2023 1039   ALBUMIN 4.4 11/02/2023 1039   AST 13 11/02/2023 1039   ALT 14 11/02/2023 1039   ALKPHOS 73 11/02/2023 1039   BILITOT 0.4 11/02/2023 1039   GFRNONAA 104 09/07/2018 0933   GFRAA 120 09/07/2018 0933       Component Value Date/Time   WBC 8.8 10/03/2021 1043   WBC 10.2 06/27/2013 0847   RBC 4.93 10/03/2021 1043   RBC 5.01 06/27/2013 0847   HGB 12.1 10/03/2021 1043   HGB 14.3 05/04/2015 0909   HCT 37.2 10/03/2021 1043   PLT 370 10/03/2021 1043   MCV 76 (L) 10/03/2021 1043   MCH 24.5 (L) 10/03/2021 1043   MCH 30.7 06/27/2013 0847   MCHC 32.5 10/03/2021 1043   MCHC 37.8 (H) 06/27/2013 0847   RDW 17.2 (H) 10/03/2021 1043   LYMPHSABS 1.9 07/15/2011 1206   MONOABS 0.4 07/15/2011 1206   EOSABS 0.0 07/15/2011 1206   BASOSABS 0.1 07/15/2011 1206    No results found for: POCLITH, LITHIUM   No results found for: PHENYTOIN, PHENOBARB, VALPROATE, CBMZ   .res Assessment: Plan:    Treatment Plan/Recommendations:   PDMP reviewed  Taper off Lexapro  20mg  and start Zoloft  - instructions given. Take 1/2 tablet of Lexapro  20mg  - 10mg  for 2 weeks. Add Zoloft  100mg  - take 1/2 tablet daily x 14 days then increase to 100mg  daily.  D/C Wellbutrin  XL 150mg   daily - made her too anxious and not hungry.   RTC 4 weeks  25 minutes spent dedicated to the care of this patient on the date of this encounter to include pre-visit review of records, ordering of medication, post visit documentation, and face-to-face time with the patient discussing anxiety. Discussed switching Lexapro  to Zoloft  - discussed a 2 week taper plan off of the Lexapro  and onto the Zoloft . Wellbutrin  was d/c'd before appointment due to toleration issues.  Patient advised to contact office with any questions, adverse effects, or acute worsening in signs and symptoms.  There are no diagnoses linked to this encounter.   Please see After Visit Summary for patient specific instructions.  Future Appointments  Date Time Provider Department Center  02/02/2024  2:00 PM Secundino Ellithorpe Nattalie, NP [facility] None  04/22/2024  9:30 AM Caudle, Thersia Bitters, FNP [facility]    No orders of the defined types were placed in this encounter.     -------------------------------

## 2024-02-25 ENCOUNTER — Other Ambulatory Visit: Payer: Self-pay | Admitting: Adult Health

## 2024-02-25 DIAGNOSIS — F331 Major depressive disorder, recurrent, moderate: Secondary | ICD-10-CM

## 2024-02-25 DIAGNOSIS — F411 Generalized anxiety disorder: Secondary | ICD-10-CM

## 2024-03-01 ENCOUNTER — Telehealth (INDEPENDENT_AMBULATORY_CARE_PROVIDER_SITE_OTHER): Admitting: Adult Health

## 2024-03-01 ENCOUNTER — Encounter: Payer: Self-pay | Admitting: Adult Health

## 2024-03-01 DIAGNOSIS — F411 Generalized anxiety disorder: Secondary | ICD-10-CM | POA: Diagnosis not present

## 2024-03-01 DIAGNOSIS — F331 Major depressive disorder, recurrent, moderate: Secondary | ICD-10-CM

## 2024-03-01 NOTE — Progress Notes (Signed)
 Erin [name] [id number] 09/23/1965 58 y.o.  Virtual Visit via Video Note  I connected with pt @ on 03/01/24 at  3:30 PM EDT by a video enabled telemedicine application and verified that I am speaking with the correct person using two identifiers.   I discussed the limitations of evaluation and management by telemedicine and the availability of in person appointments. The patient expressed understanding and agreed to proceed.  I discussed the assessment and treatment plan with the patient. The patient was provided an opportunity to ask questions and all were answered. The patient agreed with the plan and demonstrated an understanding of the instructions.   The patient was advised to call back or seek an in-person evaluation if the symptoms worsen or if the condition fails to improve as anticipated.  I provided 15 minutes of non-face-to-face time during this encounter.  The patient was located at home.  The provider was located at [facility].   Angeline LOISE Sayers, NP   Subjective:   Patient ID:  Erin [name] is a 58 y.o. (DOB 22-Jul-1965) female.  Chief Complaint: No chief complaint on file.   HPI [name] presents for follow-up of MDD and GAD.  Describes mood today as better. Pleasant. Denies tearfulness. Mood symptoms - denies depression, irritability and anxiety. Reports improved interest and motivation. Denies panic attacks. Reports decreased worry, rumination and over thinking. Denies obsessive thoughts or acts. Reports mood is improved. Stating I feel like I'm doing better. Feels like the addition of Zoloft  has been helped. Has tapered off the Lexapro .Taking medications as prescribed.  Energy levels better. Active, does not have a regular exercise routine. Enjoys some usual interests and activities. Single. Lives alone. Spending time with family and friends. Appetite adequate. Weight stable. Sleeps well most nights. Averages 5 to 6 hours. Focus and  concentration stable. Completing tasks. Managing aspects of household. 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  Denies SI or HI.  Denies AH or VH. Denies self harm. Denies substance use. Reports seeing a therapist in the past.   Previous medication trials: Prozac - reports did not help, Lexapro  20mg  daily approximately   Review of Systems:  Review of Systems  Musculoskeletal:  Negative for gait problem.  Neurological:  Negative for tremors.  Psychiatric/Behavioral:         Please refer to HPI    Medications: I have reviewed the patient's current medications.  Current Outpatient Medications  Medication Sig Dispense Refill   estradiol  (VIVELLE -DOT) 0.05 MG/24HR patch Place 1 patch (0.05 mg total) onto the skin 2 (two) times a week. 24 patch 3   fish oil-omega-3 fatty acids 1000 MG capsule Take 1 g by mouth daily.     Multiple Vitamin (MULTIVITAMIN) tablet Take 1 tablet by mouth daily.     NON FORMULARY Testosterone pellet 137.5 mg     progesterone  (PROMETRIUM ) 200 MG capsule Take 1 capsule daily days 1-14 each month 42 capsule 4   sertraline  (ZOLOFT ) 100 MG tablet Take 1 tablet (100 mg total) by mouth daily. 30 tablet 2   VYZULTA 0.024 % SOLN Place 1 drop into the right eye at bedtime.     No current facility-administered medications for this visit.    Medication Side Effects: None  Allergies:  Allergies  Allergen Reactions   Prozac [Fluoxetine]     Suicidal thoughts and depression   Sulfamethoxazole     REACTION: hives    Past Medical History:  Diagnosis Date   Anxiety  Gonorrhea 1988   Headache    menstrual   Sickle cell trait (HCC)        Vaginal Pap smear, abnormal    White coat syndrome without diagnosis of hypertension 09/07/2018    Family History  Problem Relation Age of Onset   Hypertension Mother    Anxiety disorder Mother    Tuberculosis Father        ?/not sure   Heart disease Maternal Grandmother    Hypertension Sister     Social History    Socioeconomic History   Marital status: Single    Spouse name: Not on file   Number of children: Not on file   Years of education: Not on file   Highest education level: Not on file  Occupational History   Not on file  Tobacco Use   Smoking status: Never   Smokeless tobacco: Never  Vaping Use   Vaping status: Never Used  Substance and Sexual Activity   Alcohol use: Yes    Alcohol/week: 0.0 - 1.0 standard drinks of alcohol   Drug use: No   Sexual activity: Not Currently    Partners: Male    Birth control/protection: Abstinence, Post-menopausal  Other Topics Concern   Not on file  Social History Narrative   Not on file   Social Drivers of Health   Financial Resource Strain: Not on file  Food Insecurity: Not on file  Transportation Needs: Not on file  Physical Activity: Not on file  Stress: Not on file  Social Connections: Not on file  Intimate Partner Violence: Not on file    Past Medical History, Surgical history, Social history, and Family history were reviewed and updated as appropriate.   Please see review of systems for further details on the patient's review from today.   Objective:   Physical Exam:  There were no vitals taken for this visit.  Physical Exam Constitutional:      General: She is not in acute distress. Musculoskeletal:        General: No deformity.  Neurological:     Mental Status: She is alert and oriented to person, place, and time.     Coordination: Coordination normal.  Psychiatric:        Attention and Perception: Attention and perception normal. She does not perceive auditory or visual hallucinations.        Mood and Affect: Mood normal. Mood is not anxious or depressed. Affect is not labile, blunt, angry or inappropriate.        Speech: Speech normal.        Behavior: Behavior normal.        Thought Content: Thought content normal. Thought content is not paranoid or delusional. Thought content does not include homicidal or  suicidal ideation. Thought content does not include homicidal or suicidal plan.        Cognition and Memory: Cognition and memory normal.        Judgment: Judgment normal.     Comments: Insight intact     Lab Review:     Component Value Date/Time   NA 138 11/02/2023 1039   K 4.0 11/02/2023 1039   CL 101 11/02/2023 1039   CO2 22 11/02/2023 1039   GLUCOSE 101 (H) 11/02/2023 1039   GLUCOSE 91 05/04/2015 0927   BUN 8 11/02/2023 1039   CREATININE 0.85 11/02/2023 1039   CREATININE 0.88 05/04/2015 0927   CALCIUM  9.4 11/02/2023 1039   PROT 7.3 11/02/2023 1039   ALBUMIN 4.4 11/02/2023  1039   AST 13 11/02/2023 1039   ALT 14 11/02/2023 1039   ALKPHOS 73 11/02/2023 1039   BILITOT 0.4 11/02/2023 1039   GFRNONAA 104 09/07/2018 0933   GFRAA 120 09/07/2018 0933       Component Value Date/Time   WBC 8.8 10/03/2021 1043   WBC 10.2 06/27/2013 0847   RBC 4.93 10/03/2021 1043   RBC 5.01 06/27/2013 0847   HGB 12.1 10/03/2021 1043   HGB 14.3 05/04/2015 0909   HCT 37.2 10/03/2021 1043   PLT 370 10/03/2021 1043   MCV 76 (L) 10/03/2021 1043   MCH 24.5 (L) 10/03/2021 1043   MCH 30.7 06/27/2013 0847   MCHC 32.5 10/03/2021 1043   MCHC 37.8 (H) 06/27/2013 0847   RDW 17.2 (H) 10/03/2021 1043   LYMPHSABS 1.9 07/15/2011 1206   MONOABS 0.4 07/15/2011 1206   EOSABS 0.0 07/15/2011 1206   BASOSABS 0.1 07/15/2011 1206    No results found for: POCLITH, LITHIUM   No results found for: PHENYTOIN, PHENOBARB, VALPROATE, CBMZ   .res Assessment: Plan:    Treatment Plan/Recommendations:   PDMP reviewed  Zoloft  100mg     RTC 4/6 weeks  15 minutes spent dedicated to the care of this patient on the date of this encounter to include pre-visit review of records, ordering of medication, post visit documentation, and face-to-face time with the patient discussing anxiety. Discussed continuing Zoloft  100mg  daily.  Patient advised to contact office with any questions, adverse effects, or  acute worsening in signs and symptoms.  There are no diagnoses linked to this encounter.   Please see After Visit Summary for patient specific instructions.  Future Appointments  Date Time Provider Department Center  03/01/2024  3:30 PM Fausto Sampedro Nattalie, NP [facility] None  04/22/2024  9:30 AM Caudle, Thersia Bitters, FNP [facility]    No orders of the defined types were placed in this encounter.     -------------------------------

## 2024-03-25 ENCOUNTER — Other Ambulatory Visit: Payer: Self-pay | Admitting: Adult Health

## 2024-03-25 DIAGNOSIS — F331 Major depressive disorder, recurrent, moderate: Secondary | ICD-10-CM

## 2024-03-25 DIAGNOSIS — F411 Generalized anxiety disorder: Secondary | ICD-10-CM

## 2024-03-30 ENCOUNTER — Telehealth: Payer: Self-pay | Admitting: Adult Health

## 2024-03-30 NOTE — Telephone Encounter (Signed)
 FYI  Pt lvm stating don't need apt. Feeling great. Wants to keep it going.

## 2024-03-31 ENCOUNTER — Telehealth: Admitting: Adult Health

## 2024-04-22 ENCOUNTER — Ambulatory Visit (HOSPITAL_BASED_OUTPATIENT_CLINIC_OR_DEPARTMENT_OTHER): Admitting: Family Medicine

## 2024-04-28 ENCOUNTER — Other Ambulatory Visit: Payer: Self-pay | Admitting: Adult Health

## 2024-04-28 DIAGNOSIS — F411 Generalized anxiety disorder: Secondary | ICD-10-CM

## 2024-04-28 DIAGNOSIS — F331 Major depressive disorder, recurrent, moderate: Secondary | ICD-10-CM

## 2024-04-28 NOTE — Telephone Encounter (Signed)
 Was due last month but she canceled appt. Sent MyChart msg.

## 2024-05-03 ENCOUNTER — Telehealth (INDEPENDENT_AMBULATORY_CARE_PROVIDER_SITE_OTHER): Admitting: Adult Health

## 2024-05-03 ENCOUNTER — Encounter: Payer: Self-pay | Admitting: Adult Health

## 2024-05-03 DIAGNOSIS — F411 Generalized anxiety disorder: Secondary | ICD-10-CM

## 2024-05-03 DIAGNOSIS — F331 Major depressive disorder, recurrent, moderate: Secondary | ICD-10-CM | POA: Diagnosis not present

## 2024-05-03 MED ORDER — SERTRALINE HCL 100 MG PO TABS: ORAL_TABLET | ORAL | 1 refills | Status: AC

## 2024-05-03 NOTE — Progress Notes (Signed)
 Erin [name] [id number] 21-Sep-1965 58 y.o.  Virtual Visit via Video Note  I connected with pt @ on 05/03/24 at  9:00 AM EDT by a video enabled telemedicine application and verified that I am speaking with the correct person using two identifiers.   I discussed the limitations of evaluation and management by telemedicine and the availability of in person appointments. The patient expressed understanding and agreed to proceed.  I discussed the assessment and treatment plan with the patient. The patient was provided an opportunity to ask questions and all were answered. The patient agreed with the plan and demonstrated an understanding of the instructions.   The patient was advised to call back or seek an in-person evaluation if the symptoms worsen or if the condition fails to improve as anticipated.  I provided 15 minutes of non-face-to-face time during this encounter.  The patient was located at home.  The provider was located at [facility].   Angeline LOISE Sayers, NP   Subjective:   Patient ID:  PAIJE GOODHART is a 58 y.o. (DOB [birth year]) female.  Chief Complaint: No chief complaint on file.   HPI Erin [name] presents for follow-up of MDD and GAD.  Describes mood today as ok. Pleasant. Denies tearfulness. Mood symptoms - denies depression, irritability and anxiety. Reports stable interest and motivation. Denies panic attacks. Reports decreased worry, rumination and over thinking. Denies obsessive thoughts or acts. Reports mood is consistent. Stating I feel like I'm doing ok. Feels like the Zoloft  has been helpful for mood symptoms, but would like to try increasing the dose from 100mg  to 150mg  daily. Taking medications as prescribed.  Energy levels stable. Active, does not have a regular exercise routine. Enjoys some usual interests and activities. Dating. Lives alone. Spending time with family and friends. Appetite adequate. Weight stable. Sleeps well most nights.  Averages 5 to 6 hours. Focus and concentration stable. Completing tasks. Managing aspects of household. 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  Denies SI or HI.  Denies AH or VH. Denies self harm. Denies substance use. Reports seeing a therapist in the past.   Previous medication trials: Prozac - reports did not help, Lexapro  20mg  daily approximately  Review of Systems:  Review of Systems  Musculoskeletal:  Negative for gait problem.  Neurological:  Negative for tremors.  Psychiatric/Behavioral:         Please refer to HPI   Medications: I have reviewed the patient's current medications.  Current Outpatient Medications  Medication Sig Dispense Refill   estradiol  (VIVELLE -DOT) 0.05 MG/24HR patch Place 1 patch (0.05 mg total) onto the skin 2 (two) times a week. 24 patch 3   fish oil-omega-3 fatty acids 1000 MG capsule Take 1 g by mouth daily.     Multiple Vitamin (MULTIVITAMIN) tablet Take 1 tablet by mouth daily.     NON FORMULARY Testosterone pellet 137.5 mg     progesterone  (PROMETRIUM ) 200 MG capsule Take 1 capsule daily days 1-14 each month 42 capsule 4   sertraline  (ZOLOFT ) 100 MG tablet Take 1 tablet (100 mg total) by mouth daily. 30 tablet 2   VYZULTA 0.024 % SOLN Place 1 drop into the right eye at bedtime.     No current facility-administered medications for this visit.   Medication Side Effects: None  Allergies:  Allergies  Allergen Reactions   Prozac [Fluoxetine]     Suicidal thoughts and depression   Sulfamethoxazole     REACTION: hives    Past Medical  History:  Diagnosis Date   Anxiety    Gonorrhea 1988   Headache    menstrual   Sickle cell trait        Vaginal Pap smear, abnormal    White coat syndrome without diagnosis of hypertension 09/07/2018    Family History  Problem Relation Age of Onset   Hypertension Mother    Anxiety disorder Mother    Tuberculosis Father        ?/not sure   Heart disease Maternal Grandmother    Hypertension Sister      Social History   Socioeconomic History   Marital status: Single    Spouse name: Not on file   Number of children: Not on file   Years of education: Not on file   Highest education level: Not on file  Occupational History   Not on file  Tobacco Use   Smoking status: Never   Smokeless tobacco: Never  Vaping Use   Vaping status: Never Used  Substance and Sexual Activity   Alcohol use: Yes    Alcohol/week: 0.0 - 1.0 standard drinks of alcohol   Drug use: No   Sexual activity: Not Currently    Partners: Male    Birth control/protection: Abstinence, Post-menopausal  Other Topics Concern   Not on file  Social History Narrative   Not on file   Social Drivers of Health   Financial Resource Strain: Not on file  Food Insecurity: Not on file  Transportation Needs: Not on file  Physical Activity: Not on file  Stress: Not on file  Social Connections: Not on file  Intimate Partner Violence: Not on file    Past Medical History, Surgical history, Social history, and Family history were reviewed and updated as appropriate.   Please see review of systems for further details on the patient's review from today.   Objective:   Physical Exam:  There were no vitals taken for this visit.  Physical Exam Constitutional:      General: She is not in acute distress. Musculoskeletal:        General: No deformity.  Neurological:     Mental Status: She is alert and oriented to person, place, and time.     Coordination: Coordination normal.  Psychiatric:        Attention and Perception: Attention and perception normal. She does not perceive auditory or visual hallucinations.        Mood and Affect: Mood normal. Mood is not anxious or depressed. Affect is not labile, blunt, angry or inappropriate.        Speech: Speech normal.        Behavior: Behavior normal.        Thought Content: Thought content normal. Thought content is not paranoid or delusional. Thought content does not include  homicidal or suicidal ideation. Thought content does not include homicidal or suicidal plan.        Cognition and Memory: Cognition and memory normal.        Judgment: Judgment normal.     Comments: Insight intact     Lab Review:     Component Value Date/Time   NA 138 11/02/2023 1039   K 4.0 11/02/2023 1039   CL 101 11/02/2023 1039   CO2 22 11/02/2023 1039   GLUCOSE 101 (H) 11/02/2023 1039   GLUCOSE 91 05/04/2015 0927   BUN 8 11/02/2023 1039   CREATININE 0.85 11/02/2023 1039   CREATININE 0.88 05/04/2015 0927   CALCIUM  9.4 11/02/2023 1039  PROT 7.3 11/02/2023 1039   ALBUMIN 4.4 11/02/2023 1039   AST 13 11/02/2023 1039   ALT 14 11/02/2023 1039   ALKPHOS 73 11/02/2023 1039   BILITOT 0.4 11/02/2023 1039   GFRNONAA 104 09/07/2018 0933   GFRAA 120 09/07/2018 0933       Component Value Date/Time   WBC 8.8 10/03/2021 1043   WBC 10.2 06/27/2013 0847   RBC 4.93 10/03/2021 1043   RBC 5.01 06/27/2013 0847   HGB 12.1 10/03/2021 1043   HGB 14.3 05/04/2015 0909   HCT 37.2 10/03/2021 1043   PLT 370 10/03/2021 1043   MCV 76 (L) 10/03/2021 1043   MCH 24.5 (L) 10/03/2021 1043   MCH 30.7 06/27/2013 0847   MCHC 32.5 10/03/2021 1043   MCHC 37.8 (H) 06/27/2013 0847   RDW 17.2 (H) 10/03/2021 1043   LYMPHSABS 1.9 07/15/2011 1206   MONOABS 0.4 07/15/2011 1206   EOSABS 0.0 07/15/2011 1206   BASOSABS 0.1 07/15/2011 1206    No results found for: POCLITH, LITHIUM   No results found for: PHENYTOIN, PHENOBARB, VALPROATE, CBMZ   .res Assessment: Plan:    Treatment Plan/Recommendations:   PDMP reviewed  Increase Zoloft  100mg  to 150mg  daily   RTC 6 months  15 minutes spent dedicated to the care of this patient on the date of this encounter to include pre-visit review of records, ordering of medication, post visit documentation, and face-to-face time with the patient discussing anxiety. Discussed increasing Zoloft  100mg  to 150mg  daily.  Patient advised to contact office  with any questions, adverse effects, or acute worsening in signs and symptoms.  There are no diagnoses linked to this encounter.   Please see After Visit Summary for patient specific instructions.  Future Appointments  Date Time Provider Department Center  05/03/2024  9:00 AM Briawna Carver Nattalie, NP [facility] None    No orders of the defined types were placed in this encounter.     -------------------------------

## 2024-07-12 ENCOUNTER — Other Ambulatory Visit: Payer: Self-pay | Admitting: Obstetrics & Gynecology

## 2024-07-12 DIAGNOSIS — Z1231 Encounter for screening mammogram for malignant neoplasm of breast: Secondary | ICD-10-CM

## 2024-08-26 ENCOUNTER — Ambulatory Visit
